# Patient Record
Sex: Female | Born: 1990 | Race: Black or African American | Hispanic: No | Marital: Single | Smoking: Current every day smoker
Health system: Southern US, Community
[De-identification: ages and names within clinical notes are randomized; demographics above are authoritative.]

## PROBLEM LIST (undated history)

## (undated) ENCOUNTER — Inpatient Hospital Stay (HOSPITAL_COMMUNITY): Payer: Self-pay

## (undated) DIAGNOSIS — A630 Anogenital (venereal) warts: Secondary | ICD-10-CM

## (undated) DIAGNOSIS — R87629 Unspecified abnormal cytological findings in specimens from vagina: Secondary | ICD-10-CM

## (undated) DIAGNOSIS — Z789 Other specified health status: Secondary | ICD-10-CM

## (undated) HISTORY — DX: Unspecified abnormal cytological findings in specimens from vagina: R87.629

## (undated) HISTORY — PX: DILATION AND CURETTAGE, DIAGNOSTIC / THERAPEUTIC: SUR384

## (undated) HISTORY — PX: OTHER SURGICAL HISTORY: SHX169

## (undated) HISTORY — DX: Anogenital (venereal) warts: A63.0

---
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
## 2012-05-12 ENCOUNTER — Encounter (HOSPITAL_COMMUNITY): Payer: Self-pay | Admitting: *Deleted

## 2012-05-12 ENCOUNTER — Emergency Department (HOSPITAL_COMMUNITY)
Admission: EM | Admit: 2012-05-12 | Discharge: 2012-05-12 | Disposition: A | Discharge status: Home / Self Care | Payer: BC Managed Care – PPO | Attending: Emergency Medicine | Admitting: Emergency Medicine

## 2012-05-12 DIAGNOSIS — T07XXXA Unspecified multiple injuries, initial encounter: Secondary | ICD-10-CM

## 2012-05-12 DIAGNOSIS — T148XXA Other injury of unspecified body region, initial encounter: Secondary | ICD-10-CM

## 2012-05-12 DIAGNOSIS — IMO0002 Reserved for concepts with insufficient information to code with codable children: Secondary | ICD-10-CM | POA: Insufficient documentation

## 2012-05-12 MED ORDER — MELOXICAM 7.5 MG PO TABS: ORAL_TABLET | ORAL | Status: DC

## 2012-05-12 MED ORDER — HYDROCODONE-ACETAMINOPHEN 5-325 MG PO TABS: 1.0000 | ORAL_TABLET | ORAL | Status: DC | PRN
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
## 2012-05-14 NOTE — ED Provider Notes (Signed)
Medical screening examination/treatment/procedure(s) were performed by non-physician practitioner and as supervising physician I was immediately available for consultation/collaboration.   Dione Booze, MD 05/14/12 1450

## 2014-06-12 ENCOUNTER — Encounter (HOSPITAL_COMMUNITY): Payer: Self-pay | Admitting: *Deleted

## 2014-07-12 ENCOUNTER — Other Ambulatory Visit: Payer: Self-pay | Admitting: Women's Health

## 2014-07-26 ENCOUNTER — Other Ambulatory Visit: Payer: Self-pay | Admitting: Women's Health

## 2015-12-25 ENCOUNTER — Other Ambulatory Visit: Payer: Self-pay | Admitting: Adult Health

## 2015-12-27 ENCOUNTER — Other Ambulatory Visit: Payer: Self-pay | Admitting: Adult Health

## 2015-12-27 ENCOUNTER — Encounter: Payer: Self-pay | Admitting: *Deleted

## 2016-05-22 ENCOUNTER — Other Ambulatory Visit: Payer: Self-pay | Admitting: Adult Health

## 2016-05-28 ENCOUNTER — Encounter: Payer: Self-pay | Admitting: *Deleted

## 2016-05-28 ENCOUNTER — Other Ambulatory Visit: Payer: Self-pay | Admitting: Adult Health

## 2016-06-05 ENCOUNTER — Other Ambulatory Visit (HOSPITAL_COMMUNITY)
Admission: RE | Admit: 2016-06-05 | Discharge: 2016-06-05 | Disposition: A | Discharge status: Home / Self Care | Payer: BLUE CROSS/BLUE SHIELD | Source: Ambulatory Visit | Attending: Advanced Practice Midwife | Admitting: Advanced Practice Midwife

## 2016-06-05 ENCOUNTER — Encounter: Payer: Self-pay | Admitting: Advanced Practice Midwife

## 2016-06-05 ENCOUNTER — Ambulatory Visit (INDEPENDENT_AMBULATORY_CARE_PROVIDER_SITE_OTHER): Payer: Medicaid Other | Admitting: Advanced Practice Midwife

## 2016-06-05 ENCOUNTER — Other Ambulatory Visit: Payer: Self-pay | Admitting: Advanced Practice Midwife

## 2016-06-05 VITALS — BP 110/70 | HR 74 | Ht 65.0 in | Wt 218.0 lb

## 2016-06-05 DIAGNOSIS — Z01419 Encounter for gynecological examination (general) (routine) without abnormal findings: Secondary | ICD-10-CM | POA: Diagnosis not present

## 2016-06-05 DIAGNOSIS — B9689 Other specified bacterial agents as the cause of diseases classified elsewhere: Secondary | ICD-10-CM

## 2016-06-05 DIAGNOSIS — Z113 Encounter for screening for infections with a predominantly sexual mode of transmission: Secondary | ICD-10-CM | POA: Diagnosis present

## 2016-06-05 DIAGNOSIS — Z1151 Encounter for screening for human papillomavirus (HPV): Secondary | ICD-10-CM | POA: Insufficient documentation

## 2016-06-05 DIAGNOSIS — Z0000 Encounter for general adult medical examination without abnormal findings: Secondary | ICD-10-CM

## 2016-06-05 DIAGNOSIS — N760 Acute vaginitis: Secondary | ICD-10-CM

## 2016-06-05 DIAGNOSIS — Z01411 Encounter for gynecological examination (general) (routine) with abnormal findings: Secondary | ICD-10-CM | POA: Diagnosis present

## 2016-06-05 MED ORDER — ETONOGESTREL-ETHINYL ESTRADIOL 0.12-0.015 MG/24HR VA RING: VAGINAL_RING | VAGINAL | 12 refills | Status: DC

## 2016-06-05 MED ORDER — METRONIDAZOLE 0.75 % VA GEL
1.0000 | Freq: Every day | VAGINAL | 1 refills | Status: DC
Start: 2016-06-05 — End: 2016-07-10

## 2016-06-05 NOTE — Progress Notes (Signed)
Latoya [name] 25 y.o.  Vitals:   06/05/16 1518  BP: 110/70  Pulse: 74     Filed Weights   06/05/16 1518  Weight: 218 lb (98.9 kg)    Past Medical History: Past Medical History:  Diagnosis Date  . No pertinent past medical history     Past Surgical History: Past Surgical History:  Procedure Laterality Date  . NO PAST SURGERIES      Family History: History reviewed. No pertinent family history.  Social History: Social History  Substance Use Topics  . Smoking status: Never Smoker  . Smokeless tobacco: Never Used  . Alcohol use No    Allergies: No Known Allergies   No current outpatient prescriptions on file.  History of Present Illness: here for pap.  Last pap never.  Is currently having sex.  Uses Mirena for birth control  Is due to come out now  Wants to do nuva ring instead (some pain, wonders if it's IUD).     Review of Systems   Patient denies any headaches, blurred vision, shortness of breath, chest pain, abdominal pain, problems with bowel movements, urination.  Sometimes intercourse is painful, wonders if it's the IU.   Physical Exam: General:  Well developed, well nourished, no acute distress Skin:  Warm and dry Neck:  Midline trachea, normal thyroid Lungs; Clear to auscultation bilaterally Breast:  No dominant palpable mass, retraction, or nipple discharge Cardiovascular: Regular rate and rhythm Abdomen:  Soft, non tender, no hepatosplenomegaly Pelvic:  External genitalia is normal in appearance.  The vagina is normal in appearance.  Thin white DC with aminie odor, c/w BV. The cervix is bulbous. Uterus is felt to be normal size, shape, and contour.  No adnexal masses or tenderness noted. Exam limited by habitus.  Extremities:  No swelling or varicosities noted Psych:  No mood changes.     Impression: normal GYN exam except BV     Plan: if pap normal. Repeat in 3 years Rx metrogel  Start Nuva Ring today Remove IUD in 3 weeks Info on  gardisil given; let us know if she wants a Rx

## 2016-06-05 NOTE — Addendum Note (Signed)
Addended by: Federico FlakeNES, Miron Marxen A on: 06/05/2016 04:03 PM   Modules accepted: Orders

## 2016-06-05 NOTE — Patient Instructions (Signed)
HPV (Human Papillomavirus) Vaccine--Gardasil-9:  1. Why get vaccinated? Gardasil-9 prevents human papillomavirus (HPV) types that cause many cancers, including:  cervical cancer in females,  vaginal and vulvar cancers in females,  anal cancer in females and males,  throat cancer in females and males, and  penile cancer in males. In addition, Gardasil-9 prevents HPV types that cause genital warts in both females and males. In the [place] about 12,000 women get cervical cancer every year, and about 4,000 women die from it. Gardasil-9 can prevent most of these cases of cervical cancer. Vaccination is not a substitute for cervical cancer screening. This vaccine does not protect against all HPV types that can cause cervical cancer. Women should still get regular Pap tests. HPV infection usually comes from sexual contact, and most people will become infected at some point in their life. About 14 million Americans, including teens, get infected every year. Most infections will go away and not cause serious problems. But thousands of women and men get cancer and diseases from HPV. 2. HPV vaccine Gardasil-9 is an FDA-approved HPV vaccine. It is recommended for both males and females. It is routinely given at 11 or 23 years of age, but it may be given beginning at age 9 years through age 26 years. Three doses of Gardasil-9 are recommended with the second dose given 1-2 months after the first dose and the third dose given 6 months after the first dose. 3. Some people should not get this vaccine  Anyone who has had a severe, life-threatening allergic reaction to a dose of HPV vaccine should not get another dose.  Anyone who has a severe (life threatening) allergy to any component of HPV vaccine should not get the vaccine. Tell your doctor if you have any severe allergies that you know of, including a severe allergy to yeast.  HPV vaccine is not recommended for pregnant women. If you learn that you were  pregnant when you were vaccinated, there is no reason to expect any problems for you or your baby. Any woman who learns she was pregnant when she got Gardasil-9 vaccine is encouraged to contact the manufacturer's registry for HPV vaccination during pregnancy at 1-800-986-8999. Women who are breastfeeding may be vaccinated.  If you have a mild illness, such as a cold, you can probably get the vaccine today. If you are moderately or severely ill, you should probably wait until you recover. Your doctor can advise you. 4. Risks of a vaccine reaction With any medicine, including vaccines, there is a chance of side effects. These are usually mild and go away on their own, but serious reactions are also possible. Most people who get HPV vaccine do not have any serious problems with it. Mild or moderate problems following Gardasil-9:  Reactions in the arm where the shot was given:  Soreness (about 9 people in 10)  Redness or swelling (about 1 person in 3)  Fever:  Mild (100F) (about 1 person in 10)  Moderate (102F) (about 1 person in 65)  Other problems:  Headache (about 1 person in 3) Problems that could happen after any injected vaccine:  People sometimes faint after a medical procedure, including vaccination. Sitting or lying down for about 15 minutes can help prevent fainting, and injuries caused by a fall. Tell your doctor if you feel dizzy, or have vision changes or ringing in the ears.  Some people get severe pain in the shoulder and have difficulty moving the arm where a shot was given. This happens   very rarely.  Any medication can cause a severe allergic reaction. Such reactions from a vaccine are very rare, estimated at about 1 in a million doses, and would happen within a few minutes to a few hours after the vaccination. As with any medicine, there is a very remote chance of a vaccine causing a serious injury or death. The safety of vaccines is always being monitored. For more  information, visit: www.cdc.gov/vaccinesafety/. 5. What if there is a serious reaction? What should I look for? Look for anything that concerns you, such as signs of a severe allergic reaction, very high fever, or unusual behavior. Signs of a severe allergic reaction can include hives, swelling of the face and throat, difficulty breathing, a fast heartbeat, dizziness, and weakness. These would usually start a few minutes to a few hours after the vaccination. What should I do? If you think it is a severe allergic reaction or other emergency that can't wait, call 9-1-1 or get to the nearest hospital. Otherwise, call your doctor. Afterward, the reaction should be reported to the "Vaccine Adverse Event Reporting System" (VAERS). Your doctor might file this report, or you can do it yourself through the VAERS web site at www.vaers.hhs.gov, or by calling 1-800-822-7967. VAERS does not give medical advice. 6. The National Vaccine Injury Compensation Program The National Vaccine Injury Compensation Program (VICP) is a federal program that was created to compensate people who may have been injured by certain vaccines. Persons who believe they may have been injured by a vaccine can learn about the program and about filing a claim by calling 1-800-338-2382 or visiting the VICP website at www.hrsa.gov/vaccinecompensation. There is a time limit to file a claim for compensation. 7. How can I learn more?  Ask your health care provider. He or she can give you the vaccine package insert or suggest other sources of information.  Call your local or state health department.  Contact the Centers for Disease Control and Prevention (CDC):  Call 1-800-232-4636 (1-800-CDC-INFO) or  Visit CDC's website at www.cdc.gov/hpv Vaccine Information Statement HPV Vaccine (Gardasil-9) 11/09/14   This information is not intended to replace advice given to you by your health care provider. Make sure you discuss any questions you  have with your health care provider.   Document Released: 02/22/2014 Document Revised: 12/12/2014 Document Reviewed: 02/22/2014 Elsevier Interactive Patient Education 2016 Elsevier Inc. 

## 2016-06-10 ENCOUNTER — Telehealth: Payer: Self-pay | Admitting: *Deleted

## 2016-06-10 ENCOUNTER — Other Ambulatory Visit: Payer: Self-pay | Admitting: Women's Health

## 2016-06-10 DIAGNOSIS — R87619 Unspecified abnormal cytological findings in specimens from cervix uteri: Secondary | ICD-10-CM | POA: Insufficient documentation

## 2016-06-10 DIAGNOSIS — R87610 Atypical squamous cells of undetermined significance on cytologic smear of cervix (ASC-US): Secondary | ICD-10-CM

## 2016-06-10 DIAGNOSIS — A749 Chlamydial infection, unspecified: Secondary | ICD-10-CM

## 2016-06-10 LAB — CYTOLOGY - PAP
CHLAMYDIA, DNA PROBE: POSITIVE — AB
Diagnosis: UNDETERMINED — AB
HPV (WINDOPATH): NOT DETECTED
Neisseria Gonorrhea: NEGATIVE

## 2016-06-10 MED ORDER — AZITHROMYCIN 500 MG PO TABS: 1000.0000 mg | ORAL_TABLET | Freq: Once | ORAL | 0 refills | Status: AC

## 2016-06-10 NOTE — Telephone Encounter (Signed)
Pt called at states was there anything other than the CHL abnormal on her pap. Pt informed of abnormal pap from 06/05/2016 with ASCUS results and positive CHL. Azithromycin 500 mg e-scribed. Pt verbalized understanding.

## 2016-06-10 NOTE — Telephone Encounter (Signed)
Called patient to relay message from LindyKim about pap smear results and  positive Chlamydia and need for treatment. Patient stated she would pick up medication. States she will talk to partner to see if he will go to HD or get Selena BattenKim to treat. Will call us back if we need to treat him. No further questions.

## 2016-06-19 ENCOUNTER — Telehealth: Payer: Self-pay | Admitting: Women's Health

## 2016-06-19 NOTE — Telephone Encounter (Signed)
Patient called with concerns of possible yeast infection. She has recently been on antibiotics for +CHL. Advised patient to try over the counter Monistat and see if that helps. Patient has an appt on 11/15.

## 2016-06-19 NOTE — Telephone Encounter (Signed)
Pt called stating that she would like a phone call back From Tish. Pt did not state the reason why. Please contact pt

## 2016-06-26 ENCOUNTER — Ambulatory Visit: Payer: Medicaid Other | Admitting: Advanced Practice Midwife

## 2016-07-10 ENCOUNTER — Ambulatory Visit (INDEPENDENT_AMBULATORY_CARE_PROVIDER_SITE_OTHER): Payer: BLUE CROSS/BLUE SHIELD | Admitting: Women's Health

## 2016-07-10 ENCOUNTER — Encounter: Payer: Self-pay | Admitting: Women's Health

## 2016-07-10 VITALS — BP 110/60 | HR 82 | Ht 62.0 in | Wt 215.5 lb

## 2016-07-10 DIAGNOSIS — Z30432 Encounter for removal of intrauterine contraceptive device: Secondary | ICD-10-CM

## 2016-07-10 DIAGNOSIS — A749 Chlamydial infection, unspecified: Secondary | ICD-10-CM

## 2016-07-10 NOTE — Progress Notes (Signed)
Latoya [name] is a 25 y.o. year old [id number] African American female who presents for removal of a Mirena IUD. Her Brock RaMirnea IUD was placed 2014, she wants it removed d/t pain, cramping. Was tx for +CT 06/10/16, both her and partner took meds. Pap was ASCUS w/ -HRHPV.   Patient's last menstrual period was 06/19/2016 (approximate). BP 110/60 (BP Location: Left Arm, Patient Position: Sitting, Cuff Size: Large)   Pulse 82   Ht 5\' 2"  (1.575 m)   Wt 215 lb 8 oz (97.8 kg)   LMP 06/19/2016 (Approximate)   BMI 39.42 kg/m   Time out was performed.  A graves speculum was placed in the vagina.  The cervix was visualized, and the strings were visible. They were grasped and the Mirena was easily removed intact without complications.   CT POC today Continue Nuva Ring rx'd at last visit Condoms always for STI prevention F/U 5225yr for pap & physical  Marge DuncansBooker, Yacob Wilkerson Randall CNM, [facility]NP-BC 07/10/2016 12:24 PM

## 2016-07-14 LAB — GC/CHLAMYDIA PROBE AMP
Chlamydia trachomatis, NAA: NEGATIVE
NEISSERIA GONORRHOEAE BY PCR: NEGATIVE

## 2016-07-30 ENCOUNTER — Telehealth: Payer: Self-pay | Admitting: *Deleted

## 2016-07-30 NOTE — Telephone Encounter (Signed)
Patient has been bleeding on Novaring since she has started using it. Can she be prescribed to help stop the bleeding? Please advise.

## 2016-08-06 NOTE — Telephone Encounter (Signed)
Spoke with patient regarding response from Genella RifeK. Booker, CNM that she could not take Megace while on Nuvaring. Patient states she is not bleeding now so maybe her body has adjusted. No further question.

## 2016-08-31 ENCOUNTER — Emergency Department (HOSPITAL_COMMUNITY)
Admission: EM | Admit: 2016-08-31 | Discharge: 2016-09-01 | Disposition: A | Discharge status: Home / Self Care | Payer: Medicaid Other | Attending: Emergency Medicine | Admitting: Emergency Medicine

## 2016-08-31 ENCOUNTER — Encounter (HOSPITAL_COMMUNITY): Payer: Self-pay | Admitting: *Deleted

## 2016-08-31 ENCOUNTER — Emergency Department (HOSPITAL_COMMUNITY): Payer: Medicaid Other

## 2016-08-31 DIAGNOSIS — J40 Bronchitis, not specified as acute or chronic: Secondary | ICD-10-CM | POA: Diagnosis not present

## 2016-08-31 DIAGNOSIS — R05 Cough: Secondary | ICD-10-CM | POA: Diagnosis present

## 2016-08-31 DIAGNOSIS — F17210 Nicotine dependence, cigarettes, uncomplicated: Secondary | ICD-10-CM | POA: Insufficient documentation

## 2016-08-31 NOTE — ED Triage Notes (Signed)
Pt c/o nasal congestion, cough that is productive with green to yellow sputum production for the past week

## 2016-09-01 MED ORDER — ALBUTEROL SULFATE HFA 108 (90 BASE) MCG/ACT IN AERS
2.0000 | INHALATION_SPRAY | Freq: Once | RESPIRATORY_TRACT | Status: DC
  Filled 2016-09-01: qty 6.7

## 2016-09-01 MED ORDER — PREDNISONE 20 MG PO TABS: 40.0000 mg | ORAL_TABLET | Freq: Every day | ORAL | 0 refills | Status: DC

## 2016-09-01 MED ORDER — AZITHROMYCIN 250 MG PO TABS: ORAL_TABLET | ORAL | 0 refills | Status: DC

## 2016-09-01 MED ORDER — GUAIFENESIN-CODEINE 100-10 MG/5ML PO SYRP: 10.0000 mL | ORAL_SOLUTION | Freq: Three times a day (TID) | ORAL | 0 refills | Status: DC | PRN

## 2016-09-01 MED ORDER — GUAIFENESIN-CODEINE 100-10 MG/5ML PO SOLN
10.0000 mL | Freq: Once | ORAL | Status: AC
  Administered 2016-09-01: 10 mL via ORAL
  Filled 2016-09-01: qty 10

## 2016-09-01 NOTE — Discharge Instructions (Signed)
2 puffs of the albuterol inhaler 4 times a day as needed. Tylenol or ibuprofen if needed for fever. Follow-up with your primary doctor for recheck, return to the ER for any worsening symptoms

## 2016-09-01 NOTE — ED Provider Notes (Signed)
[facility]-EMER[facility]ENCY DEPT Provider Note   CSN: [id number] Arrival date & time: 08/31/16  2217     History   Chief Complaint Chief Complaint  Patient presents with  . Cough    HPI GAL SMOLINSKI is a 26 y.o. female.  HPI   YANIRIS BRADDOCK is a 26 y.o. female who presents to the Emergency Department complaining of Persistent cough for over one week. She states the cough has been productive at times. It has been associated with nasal congestion, sore throat, and chest tightness with cough and near emesis with cough. She's been taking over-the-counter cough and cold medications without relief. She denies known fever, vomiting or hemoptysis, chest pain or shortness of breath. She states that lying down makes her cough worse.   Past Medical History:  Diagnosis Date  . No pertinent past medical history     Patient Active Problem List   Diagnosis Date Noted  . Chlamydia 06/10/2016  . Abnormal Pap smear of cervix 06/10/2016    Past Surgical History:  Procedure Laterality Date  . NO PAST SURGERIES      OB History    Gravida Para Term Preterm AB Living   1 1 1  0 0 1   SAB TAB Ectopic Multiple Live Births   0 0 0 0 1       Home Medications    Prior to Admission medications   Medication Sig Start Date End Date Taking? Authorizing Provider  etonogestrel-ethinyl estradiol (NUVARING) 0.12-0.015 MG/24HR vaginal ring Insert vaginally and leave in place for 3 consecutive weeks, then remove for 1 week. 06/05/16   Jacklyn Shell, CNM    Family History Family History  Problem Relation Age of Onset  . Colon cancer Paternal Grandfather   . Diabetes Paternal Grandmother   . Hypertension Mother     Social History Social History  Substance Use Topics  . Smoking status: Current Every Day Smoker    Types: Cigarettes  . Smokeless tobacco: Never Used     Comment: smokes 4-5 cig daily  . Alcohol use No     Allergies   Patient has no known allergies.   Review  of Systems Review of Systems  Constitutional: Negative for activity change, appetite change, chills and fever.  HENT: Positive for congestion, rhinorrhea and sore throat. Negative for facial swelling and trouble swallowing.   Eyes: Negative for visual disturbance.  Respiratory: Positive for cough and chest tightness. Negative for shortness of breath, wheezing and stridor.   Cardiovascular: Negative for chest pain.  Gastrointestinal: Negative for abdominal pain, nausea and vomiting.  Musculoskeletal: Positive for myalgias. Negative for neck pain and neck stiffness.  Skin: Negative.   Neurological: Negative for dizziness, weakness, numbness and headaches.  Hematological: Negative for adenopathy.  Psychiatric/Behavioral: Negative for confusion.  All other systems reviewed and are negative.    Physical Exam Updated Vital Signs BP 146/72 (BP Location: Left Arm)   Pulse 84   Temp 98.3 F (36.8 C) (Oral)   Resp 16   Ht 5\' 2"  (1.575 m)   Wt 98.9 kg   LMP 08/31/2016   SpO2 100%   BMI 39.87 kg/m   Physical Exam  Constitutional: She is oriented to person, place, and time. She appears well-developed and well-nourished. No distress.  HENT:  Head: Normocephalic and atraumatic.  Right Ear: Tympanic membrane and ear canal normal.  Left Ear: Tympanic membrane and ear canal normal.  Nose: Mucosal edema and rhinorrhea present.  Mouth/Throat: Uvula is midline and mucous  membranes are normal. No trismus in the jaw. No uvula swelling. Posterior oropharyngeal erythema present. No oropharyngeal exudate, posterior oropharyngeal edema or tonsillar abscesses.  Eyes: Conjunctivae are normal.  Neck: Normal range of motion and phonation normal. Neck supple. No Brudzinski's sign and no Kernig's sign noted.  Cardiovascular: Normal rate, regular rhythm, normal heart sounds and intact distal pulses.   No murmur heard. Pulmonary/Chest: Effort normal and breath sounds normal. No respiratory distress. She has  no wheezes. She has no rales.  Coarse lung sounds bilaterally without rales or wheezing.  Abdominal: Soft. She exhibits no distension. There is no tenderness. There is no rebound and no guarding.  Musculoskeletal: Normal range of motion. She exhibits no edema.  Lymphadenopathy:    She has no cervical adenopathy.  Neurological: She is alert and oriented to person, place, and time. She exhibits normal muscle tone. Coordination normal.  Skin: Skin is warm and dry.  Nursing note and vitals reviewed.    ED Treatments / Results  Labs (all labs ordered are listed, but only abnormal results are displayed) Labs Reviewed - No data to display  EKG  EKG Interpretation None       Radiology Dg Chest 2 View  Result Date: 08/31/2016 CLINICAL DATA:  26 year old female with cough. EXAM: CHEST  2 VIEW COMPARISON:  Chest radiograph dated 05/10/2004 FINDINGS: The heart size and mediastinal contours are within normal limits. Both lungs are clear. The visualized skeletal structures are unremarkable. IMPRESSION: No active cardiopulmonary disease. Electronically Signed   By: Elgie CollardArash  Radparvar M.D.   On: 08/31/2016 23:18    Procedures Procedures (including critical care time)  Medications Ordered in ED Medications  albuterol (PROVENTIL HFA;VENTOLIN HFA) 108 (90 Base) MCG/ACT inhaler 2 puff (not administered)  guaiFENesin-codeine 100-10 MG/5ML solution 10 mL (not administered)     Initial Impression / Assessment and Plan / ED Course  I have reviewed the triage vital signs and the nursing notes.  Pertinent labs & imaging results that were available during my care of the patient were reviewed by me and considered in my medical decision making (see chart for details).     Albuterol inhaler dispensed.  Likely bronchitis.  Doubt PE.  No dyspnea, tachycardia, or hypoxia.  Patient appears stable for discharge. She agrees to Rx for prednisone, azithromycin,  Robitussin AC and PCP follow-up if  needed.  Final Clinical Impressions(s) / ED Diagnoses   Final diagnoses:  Bronchitis    New Prescriptions New Prescriptions   No medications on file     Rosey Bathammy Silas Sedam, PA-C 09/01/16 1339    Glynn OctaveStephen Rancour, MD 09/01/16 2005

## 2016-09-23 DIAGNOSIS — R109 Unspecified abdominal pain: Secondary | ICD-10-CM | POA: Diagnosis not present

## 2016-09-23 DIAGNOSIS — N939 Abnormal uterine and vaginal bleeding, unspecified: Secondary | ICD-10-CM | POA: Diagnosis not present

## 2016-09-23 DIAGNOSIS — Z3490 Encounter for supervision of normal pregnancy, unspecified, unspecified trimester: Secondary | ICD-10-CM | POA: Diagnosis not present

## 2016-09-26 ENCOUNTER — Ambulatory Visit (INDEPENDENT_AMBULATORY_CARE_PROVIDER_SITE_OTHER): Payer: Medicaid Other | Admitting: Adult Health

## 2016-09-26 ENCOUNTER — Encounter: Payer: Self-pay | Admitting: Adult Health

## 2016-09-26 VITALS — BP 120/70 | HR 74 | Ht 65.0 in | Wt 229.0 lb

## 2016-09-26 DIAGNOSIS — Z3202 Encounter for pregnancy test, result negative: Secondary | ICD-10-CM | POA: Diagnosis not present

## 2016-09-26 DIAGNOSIS — F32A Depression, unspecified: Secondary | ICD-10-CM

## 2016-09-26 DIAGNOSIS — N926 Irregular menstruation, unspecified: Secondary | ICD-10-CM | POA: Diagnosis not present

## 2016-09-26 DIAGNOSIS — F329 Major depressive disorder, single episode, unspecified: Secondary | ICD-10-CM | POA: Diagnosis not present

## 2016-09-26 DIAGNOSIS — R110 Nausea: Secondary | ICD-10-CM

## 2016-09-26 LAB — POCT URINE PREGNANCY: Preg Test, Ur: NEGATIVE

## 2016-09-26 NOTE — Progress Notes (Signed)
Subjective:     Patient ID: Latoya [name], female   DOB: 04/07/1991, 26 y.o.   MRN: [id number]  HPI Alphonzo LemmingsWhitney is a 26 year old black female in complaining of irregular bleeding, on and of since 08/31/16.She had IUD removed 07/10/16 and periods had been regular, she was supposed to be on nuva ring but lost it and has not gotten another.She had some nausea and took HPT and it was positive.[facility] and was treated for infection with azithromycin and she said pregnancy test was positive there, will request records.  Review of Systems +irregular bleeding + nausea  Reviewed past medical,surgical, social and family history. Reviewed medications and allergies.     Objective:   Physical Exam BP 120/70   Pulse 74   Ht 5\' 5"  (1.651 m)   Wt 229 lb (103.9 kg)   LMP 08/31/2016   BMI 38.11 kg/m UPT negative. Skin warm and dry.Pelvic: external genitalia is normal in appearance no lesions, vagina: period like blood,urethra has no lesions or masses noted, cervix:smooth and bulbous, uterus: normal size, shape and contour, non tender, no masses felt, adnexa: no masses or tenderness noted. Bladder is non tender and no masses felt.    PHQ 9 score 12, denies any suicidal ideations, declines meds at this time.Declines for nausea, try to eat often. Will get [facility] now. Blood type A+. Assessment:     Irregular bleeding - Plan: Beta HCG, Quant  Depression, unspecified depression type  Pregnancy test negative - Plan: POCT urine pregnancy      Plan:     [facility] Will talk when results back Follow up prn

## 2016-09-27 LAB — BETA HCG QUANT (REF LAB): hCG Quant: 15 m[IU]/mL

## 2016-09-29 ENCOUNTER — Telehealth: Payer: Self-pay | Admitting: Adult Health

## 2016-09-29 DIAGNOSIS — O039 Complete or unspecified spontaneous abortion without complication: Secondary | ICD-10-CM

## 2016-09-29 NOTE — Telephone Encounter (Signed)
[facility] and that it was 15 09/26/16, so this is miscarriage.[facility] and can put nuva ring in Sunday

## 2017-01-14 ENCOUNTER — Ambulatory Visit (INDEPENDENT_AMBULATORY_CARE_PROVIDER_SITE_OTHER): Payer: Medicaid Other | Admitting: Adult Health

## 2017-01-14 ENCOUNTER — Encounter: Payer: Self-pay | Admitting: Adult Health

## 2017-01-14 VITALS — BP 112/50 | HR 78 | Ht 66.0 in | Wt 236.5 lb

## 2017-01-14 DIAGNOSIS — Z3490 Encounter for supervision of normal pregnancy, unspecified, unspecified trimester: Secondary | ICD-10-CM

## 2017-01-14 DIAGNOSIS — N912 Amenorrhea, unspecified: Secondary | ICD-10-CM

## 2017-01-14 DIAGNOSIS — O3680X0 Pregnancy with inconclusive fetal viability, not applicable or unspecified: Secondary | ICD-10-CM | POA: Diagnosis not present

## 2017-01-14 DIAGNOSIS — Z3201 Encounter for pregnancy test, result positive: Secondary | ICD-10-CM

## 2017-01-14 LAB — POCT URINE PREGNANCY: Preg Test, Ur: POSITIVE — AB

## 2017-01-14 MED ORDER — PRENATAL PLUS/IRON 27-1 MG PO TABS: ORAL_TABLET | ORAL | 12 refills | Status: DC

## 2017-01-14 NOTE — Progress Notes (Signed)
Subjective:     Patient ID: Latoya [name], female   DOB: 11/11/1990, 26 y.o.   MRN: [id number]  HPI Latoya [name] is a 26 year old black female in for UPT, has missed a period and had 2+HPT.She has had nausea and cramping, no bleeding.   Review of Systems +missed period with +HPT Nausea and cramping, no bleeding Reviewed past medical,surgical, social and family history. Reviewed medications and allergies.     Objective:   Physical Exam BP (!) 112/50 (BP Location: Left Arm, Patient Position: Sitting, Cuff Size: Large)   Pulse 78   Ht 5\' 6"  (1.676 m)   Wt 236 lb 8 oz (107.3 kg)   LMP 12/01/2016 (Approximate)   BMI 38.17 kg/m UPT +, about 6+2 weeks by LMP with EDD 09/07/17. Skin warm and dry. Neck: mid line trachea, normal thyroid, good ROM, no lymphadenopathy noted. Lungs: clear to ausculation bilaterally. Cardiovascular: regular rate and rhythm.Abdomen is soft and non tender.    Will get US in 1 week.  Assessment:     1. Pregnancy examination or test, positive result   2. Pregnancy, unspecified gestational age   653. Encounter to determine fetal viability of pregnancy, single or unspecified fetus       Plan:     Meds ordered this encounter  Medications  . Prenatal Vit-Fe Fumarate-FA (PRENATAL PLUS/IRON) 27-1 MG TABS    Sig: Take 1 daily    Dispense:  30 each    Refill:  12    Order Specific Question:   Supervising Provider    Answer:   Duane LopeEURE, LUTHER H [[id number]]  Return in 1 week for dating US Increase fluids, eat often  And decrease smoking

## 2017-01-14 NOTE — Patient Instructions (Signed)
Eat often  Push fluids

## 2017-01-23 ENCOUNTER — Other Ambulatory Visit: Payer: Medicaid Other

## 2017-01-29 ENCOUNTER — Ambulatory Visit (INDEPENDENT_AMBULATORY_CARE_PROVIDER_SITE_OTHER): Payer: Medicaid Other

## 2017-01-29 DIAGNOSIS — O3680X0 Pregnancy with inconclusive fetal viability, not applicable or unspecified: Secondary | ICD-10-CM

## 2017-01-29 NOTE — Progress Notes (Signed)
[place] 8+3 wks,single IUP w/ys,pos fht 164 bpm,normal ovaries bilat,crl 19.08 mm,EDD 09/07/2017

## 2017-02-13 ENCOUNTER — Ambulatory Visit (INDEPENDENT_AMBULATORY_CARE_PROVIDER_SITE_OTHER): Payer: Medicaid Other | Admitting: Women's Health

## 2017-02-13 ENCOUNTER — Encounter: Payer: Self-pay | Admitting: Women's Health

## 2017-02-13 ENCOUNTER — Ambulatory Visit: Payer: Medicaid Other | Admitting: *Deleted

## 2017-02-13 VITALS — BP 120/70 | HR 91 | Wt 240.0 lb

## 2017-02-13 DIAGNOSIS — F17200 Nicotine dependence, unspecified, uncomplicated: Secondary | ICD-10-CM | POA: Diagnosis not present

## 2017-02-13 DIAGNOSIS — Z3481 Encounter for supervision of other normal pregnancy, first trimester: Secondary | ICD-10-CM

## 2017-02-13 DIAGNOSIS — Z3490 Encounter for supervision of normal pregnancy, unspecified, unspecified trimester: Secondary | ICD-10-CM | POA: Insufficient documentation

## 2017-02-13 DIAGNOSIS — Z3A10 10 weeks gestation of pregnancy: Secondary | ICD-10-CM

## 2017-02-13 DIAGNOSIS — Z1389 Encounter for screening for other disorder: Secondary | ICD-10-CM

## 2017-02-13 DIAGNOSIS — Z131 Encounter for screening for diabetes mellitus: Secondary | ICD-10-CM

## 2017-02-13 LAB — POCT URINALYSIS DIPSTICK
Blood, UA: NEGATIVE
Glucose, UA: NEGATIVE
KETONES UA: NEGATIVE
LEUKOCYTES UA: NEGATIVE
NITRITE UA: NEGATIVE
PROTEIN UA: NEGATIVE

## 2017-02-13 NOTE — Progress Notes (Signed)
Subjective:  Latoya [name] is a 26 y.o. G40P1011 African American female at [redacted]w[redacted]d by LMP c/w 8wk u/s, being seen today for her first obstetrical visit.  Her obstetrical history is significant for term VAVB x 1, sab x1; smoker- 1ppd prior to pregnancy, now down to 1-2/day and trying to quit; abnormal pap 10/26- needs repeat after 10/26.  Pregnancy history fully reviewed.  Patient reports some nausea- declines meds; some depression- is situational- currently living w/ her parents and extended family- lots of tension- trying to find her own home before this baby comes- denies SI. Marland Kitchen Denies vb, cramping, uti s/s, abnormal/malodorous vag d/c, or vulvovaginal itching/irritation.  BP 120/70   Pulse 91   Wt 240 lb (108.9 kg)   LMP 12/01/2016 (Approximate)   BMI 38.74 kg/m   HISTORY: OB History  Gravida Para Term Preterm AB Living  3 1 1  0 1 1  SAB TAB Ectopic Multiple Live Births  1 0 0 0 1    # Outcome Date GA Lbr Len/2nd Weight Sex Delivery Anes PTL Lv  3 Current           2 SAB 09/2016          1 Term 05/09/11 [redacted]w[redacted]d 13:36 / 01:49 7 lb 4.8 oz (3.311 kg) M Vag-Vacuum EPI N LIV     Past Medical History:  Diagnosis Date  . No pertinent past medical history   . Vaginal Pap smear, abnormal    Past Surgical History:  Procedure Laterality Date  . NO PAST SURGERIES     Family History  Problem Relation Age of Onset  . Colon cancer Paternal Grandfather   . Diabetes Paternal Grandmother   . Cancer Maternal Grandmother        breast  . Cancer Maternal Grandfather   . Hypertension Mother   . Stroke Maternal Aunt   . Mental retardation Maternal Aunt   . Cancer Maternal Aunt        breast  . Mental retardation Maternal Uncle   . Hypertension Maternal Uncle   . Stroke Maternal Uncle   . Diabetes Maternal Uncle   . Cancer Maternal Uncle        throat    Exam   System:     General: Well developed & nourished, no acute distress   Skin: Warm & dry, normal coloration and turgor,  no rashes   Neurologic: Alert & oriented, normal mood   Cardiovascular: Regular rate & rhythm   Respiratory: Effort & rate normal, LCTAB, acyanotic   Abdomen: Soft, non tender   Extremities: normal strength, tone  Thin prep pap smear ASCUS w/ neg HRHPV 06/05/16  FHR: + w/ active fetus via informal transabdominal u/s   Assessment:   Pregnancy: G3P1011 Patient Active Problem List   Diagnosis Date Noted  . Supervision of normal pregnancy 02/13/2017  . Smoker 02/13/2017  . Chlamydia 06/10/2016  . Abnormal Pap smear of cervix 06/10/2016    [redacted]w[redacted]d G3P1011 New OB visit Situational depression Abnormal pap Smoker  Plan:  Initial labs obtained Continue prenatal vitamins Problem list reviewed and updated Reviewed n/v relief measures and warning s/s to report Reviewed recommended weight gain based on pre-gravid BMI Encouraged well-balanced diet Genetic Screening discussed: declined Cystic fibrosis screening discussed declined Ultrasound discussed; fetal survey: requested Follow up in 4 weeks for visit [facility] completed Needs pap after 10/26 Smokes 1-2 cigarettes/day, advised cessation, discussed risks to fetus while pregnant, to infant pp, and to herself. Offered QuitlineNC, declined Discussed  situational depression- she feels she just needs to move out of the house she is in and everything would be fine. To let us know if anything worsens.      Marge DuncansBooker, Latoya [name] CNM, [facility]NP-BC 02/13/2017 10:53 AM

## 2017-02-13 NOTE — Patient Instructions (Signed)

## 2017-02-14 LAB — MED LIST OPTION NOT SELECTED

## 2017-02-14 LAB — GC/CHLAMYDIA PROBE AMP
CHLAMYDIA, DNA PROBE: NEGATIVE
NEISSERIA GONORRHOEAE BY PCR: NEGATIVE

## 2017-02-15 LAB — URINE CULTURE

## 2017-02-16 ENCOUNTER — Encounter: Payer: Self-pay | Admitting: Women's Health

## 2017-02-16 DIAGNOSIS — F1290 Cannabis use, unspecified, uncomplicated: Secondary | ICD-10-CM | POA: Insufficient documentation

## 2017-02-16 LAB — SICKLE CELL SCREEN: Sickle Cell Screen: NEGATIVE

## 2017-02-16 LAB — CBC
HEMATOCRIT: 37.8 % (ref 34.0–46.6)
HEMOGLOBIN: 12.7 g/dL (ref 11.1–15.9)
MCH: 30 pg (ref 26.6–33.0)
MCHC: 33.6 g/dL (ref 31.5–35.7)
MCV: 89 fL (ref 79–97)
Platelets: 292 10*3/uL (ref 150–379)
RBC: 4.23 x10E6/uL (ref 3.77–5.28)
RDW: 13 % (ref 12.3–15.4)
WBC: 9.9 10*3/uL (ref 3.4–10.8)

## 2017-02-16 LAB — URINALYSIS, ROUTINE W REFLEX MICROSCOPIC
Bilirubin, UA: NEGATIVE
GLUCOSE, UA: NEGATIVE
KETONES UA: NEGATIVE
LEUKOCYTES UA: NEGATIVE
NITRITE UA: NEGATIVE
Protein, UA: NEGATIVE
RBC, UA: NEGATIVE
Urobilinogen, Ur: 0.2 mg/dL (ref 0.2–1.0)
pH, UA: 6 (ref 5.0–7.5)

## 2017-02-16 LAB — PMP SCREEN PROFILE (10S), URINE
AMPHETAMINE SCREEN URINE: NEGATIVE ng/mL
BARBITURATE SCREEN URINE: NEGATIVE ng/mL
BENZODIAZEPINE SCREEN, URINE: NEGATIVE ng/mL
CANNABINOIDS UR QL SCN: POSITIVE ng/mL — AB
COCAINE(METAB.)SCREEN, URINE: NEGATIVE ng/mL
Creatinine(Crt), U: 226.4 mg/dL (ref 20.0–300.0)
Methadone Screen, Urine: NEGATIVE ng/mL
OXYCODONE+OXYMORPHONE UR QL SCN: NEGATIVE ng/mL
Opiate Scrn, Ur: NEGATIVE ng/mL
PHENCYCLIDINE QUANTITATIVE URINE: NEGATIVE ng/mL
Ph of Urine: 5.8 (ref 4.5–8.9)
Propoxyphene Scrn, Ur: NEGATIVE ng/mL

## 2017-02-16 LAB — VARICELLA ZOSTER ANTIBODY, IGG: VARICELLA: 3112 {index} (ref >165)

## 2017-02-16 LAB — RPR: RPR Ser Ql: NONREACTIVE

## 2017-02-16 LAB — HEPATITIS B SURFACE ANTIGEN: Hepatitis B Surface Ag: NEGATIVE

## 2017-02-16 LAB — HIV ANTIBODY (ROUTINE TESTING W REFLEX): HIV SCREEN 4TH GENERATION: NONREACTIVE

## 2017-02-16 LAB — ABO/RH: RH TYPE: POSITIVE

## 2017-02-16 LAB — ANTIBODY SCREEN: ANTIBODY SCREEN: NEGATIVE

## 2017-02-16 LAB — RUBELLA SCREEN: RUBELLA: 5.37 {index} (ref >0.99)

## 2017-03-12 ENCOUNTER — Encounter: Payer: Self-pay | Admitting: Women's Health

## 2017-03-12 ENCOUNTER — Ambulatory Visit (INDEPENDENT_AMBULATORY_CARE_PROVIDER_SITE_OTHER): Payer: Medicaid Other | Admitting: Women's Health

## 2017-03-12 VITALS — BP 112/50 | HR 87 | Wt 246.5 lb

## 2017-03-12 DIAGNOSIS — Z331 Pregnant state, incidental: Secondary | ICD-10-CM

## 2017-03-12 DIAGNOSIS — Z3482 Encounter for supervision of other normal pregnancy, second trimester: Secondary | ICD-10-CM

## 2017-03-12 DIAGNOSIS — Z1389 Encounter for screening for other disorder: Secondary | ICD-10-CM

## 2017-03-12 DIAGNOSIS — Z363 Encounter for antenatal screening for malformations: Secondary | ICD-10-CM

## 2017-03-12 DIAGNOSIS — Z3A14 14 weeks gestation of pregnancy: Secondary | ICD-10-CM

## 2017-03-12 LAB — POCT URINALYSIS DIPSTICK
Glucose, UA: NEGATIVE
KETONES UA: NEGATIVE
Leukocytes, UA: NEGATIVE
Nitrite, UA: NEGATIVE
PROTEIN UA: NEGATIVE

## 2017-03-12 NOTE — Progress Notes (Signed)
Low-risk OB appointment G3P1011 3526w3d Estimated Date of Delivery: 09/07/17 BP (!) 112/50   Pulse 87   Wt 246 lb 8 oz (111.8 kg)   LMP 12/01/2016 (Approximate)   BMI 39.79 kg/m   BP, weight, and urine reviewed.  Refer to obstetrical flow sheet for FH & FHR.  No fm yet. Denies cramping, lof, vb, or uti s/s. No complaints. Reviewed warning s/s to report. Plan:  Continue routine obstetrical care  F/U in 4wks for OB appointment and anatomy u/s Declines genetic screening

## 2017-03-12 NOTE — Patient Instructions (Signed)

## 2017-03-13 ENCOUNTER — Encounter: Payer: Medicaid Other | Admitting: Obstetrics and Gynecology

## 2017-04-09 ENCOUNTER — Ambulatory Visit (INDEPENDENT_AMBULATORY_CARE_PROVIDER_SITE_OTHER): Payer: Medicaid Other

## 2017-04-09 ENCOUNTER — Ambulatory Visit (INDEPENDENT_AMBULATORY_CARE_PROVIDER_SITE_OTHER): Payer: Medicaid Other | Admitting: Obstetrics & Gynecology

## 2017-04-09 ENCOUNTER — Encounter: Payer: Self-pay | Admitting: Obstetrics & Gynecology

## 2017-04-09 VITALS — BP 128/70 | HR 87 | Wt 251.0 lb

## 2017-04-09 DIAGNOSIS — Z3482 Encounter for supervision of other normal pregnancy, second trimester: Secondary | ICD-10-CM

## 2017-04-09 DIAGNOSIS — Z363 Encounter for antenatal screening for malformations: Secondary | ICD-10-CM

## 2017-04-09 DIAGNOSIS — Z331 Pregnant state, incidental: Secondary | ICD-10-CM

## 2017-04-09 DIAGNOSIS — Z1389 Encounter for screening for other disorder: Secondary | ICD-10-CM

## 2017-04-09 DIAGNOSIS — Z3A18 18 weeks gestation of pregnancy: Secondary | ICD-10-CM

## 2017-04-09 DIAGNOSIS — Z3402 Encounter for supervision of normal first pregnancy, second trimester: Secondary | ICD-10-CM

## 2017-04-09 LAB — POCT URINALYSIS DIPSTICK
Blood, UA: NEGATIVE
Glucose, UA: NEGATIVE
KETONES UA: NEGATIVE
LEUKOCYTES UA: NEGATIVE
NITRITE UA: NEGATIVE
PROTEIN UA: NEGATIVE

## 2017-04-09 NOTE — Progress Notes (Signed)
US 18+3 wks,cephalic,post pl gr 0,cx 5.6 cm,normal ovaries bilat,svp of fluid 4.6 cm,fhr 171 bpm,LVEICF 2.2 mm,EFW 265 g anatomy complete

## 2017-04-09 NOTE — Progress Notes (Signed)
[id number] 444w3d Estimated Date of Delivery: 09/07/17  Blood pressure 128/70, pulse 87, weight 251 lb (113.9 kg), last menstrual period 12/01/2016.   BP weight and urine results all reviewed and noted.  Please refer to the obstetrical flow sheet for the fundal height and fetal heart rate documentation:  Patient reports good fetal movement, denies any bleeding and no rupture of membranes symptoms or regular contractions. Patient is without complaints. All questions were answered.  Orders Placed This Encounter  Procedures  . POCT urinalysis dipstick    Plan:  Continued routine obstetrical care, see sonogram report  Isolated LV EICF, patient declines genetic testing , <2% inicidence of aneuploidy  No Follow-up on file.

## 2017-05-03 IMAGING — DX DG CHEST 2V
2 series · 2 of 2 positions shown · non-contrast
Comparison: Chest radiograph dated 05/10/2004

CLINICAL DATA: 25-year-old female with cough.

EXAM:
CHEST  2 VIEW

[chest pa]
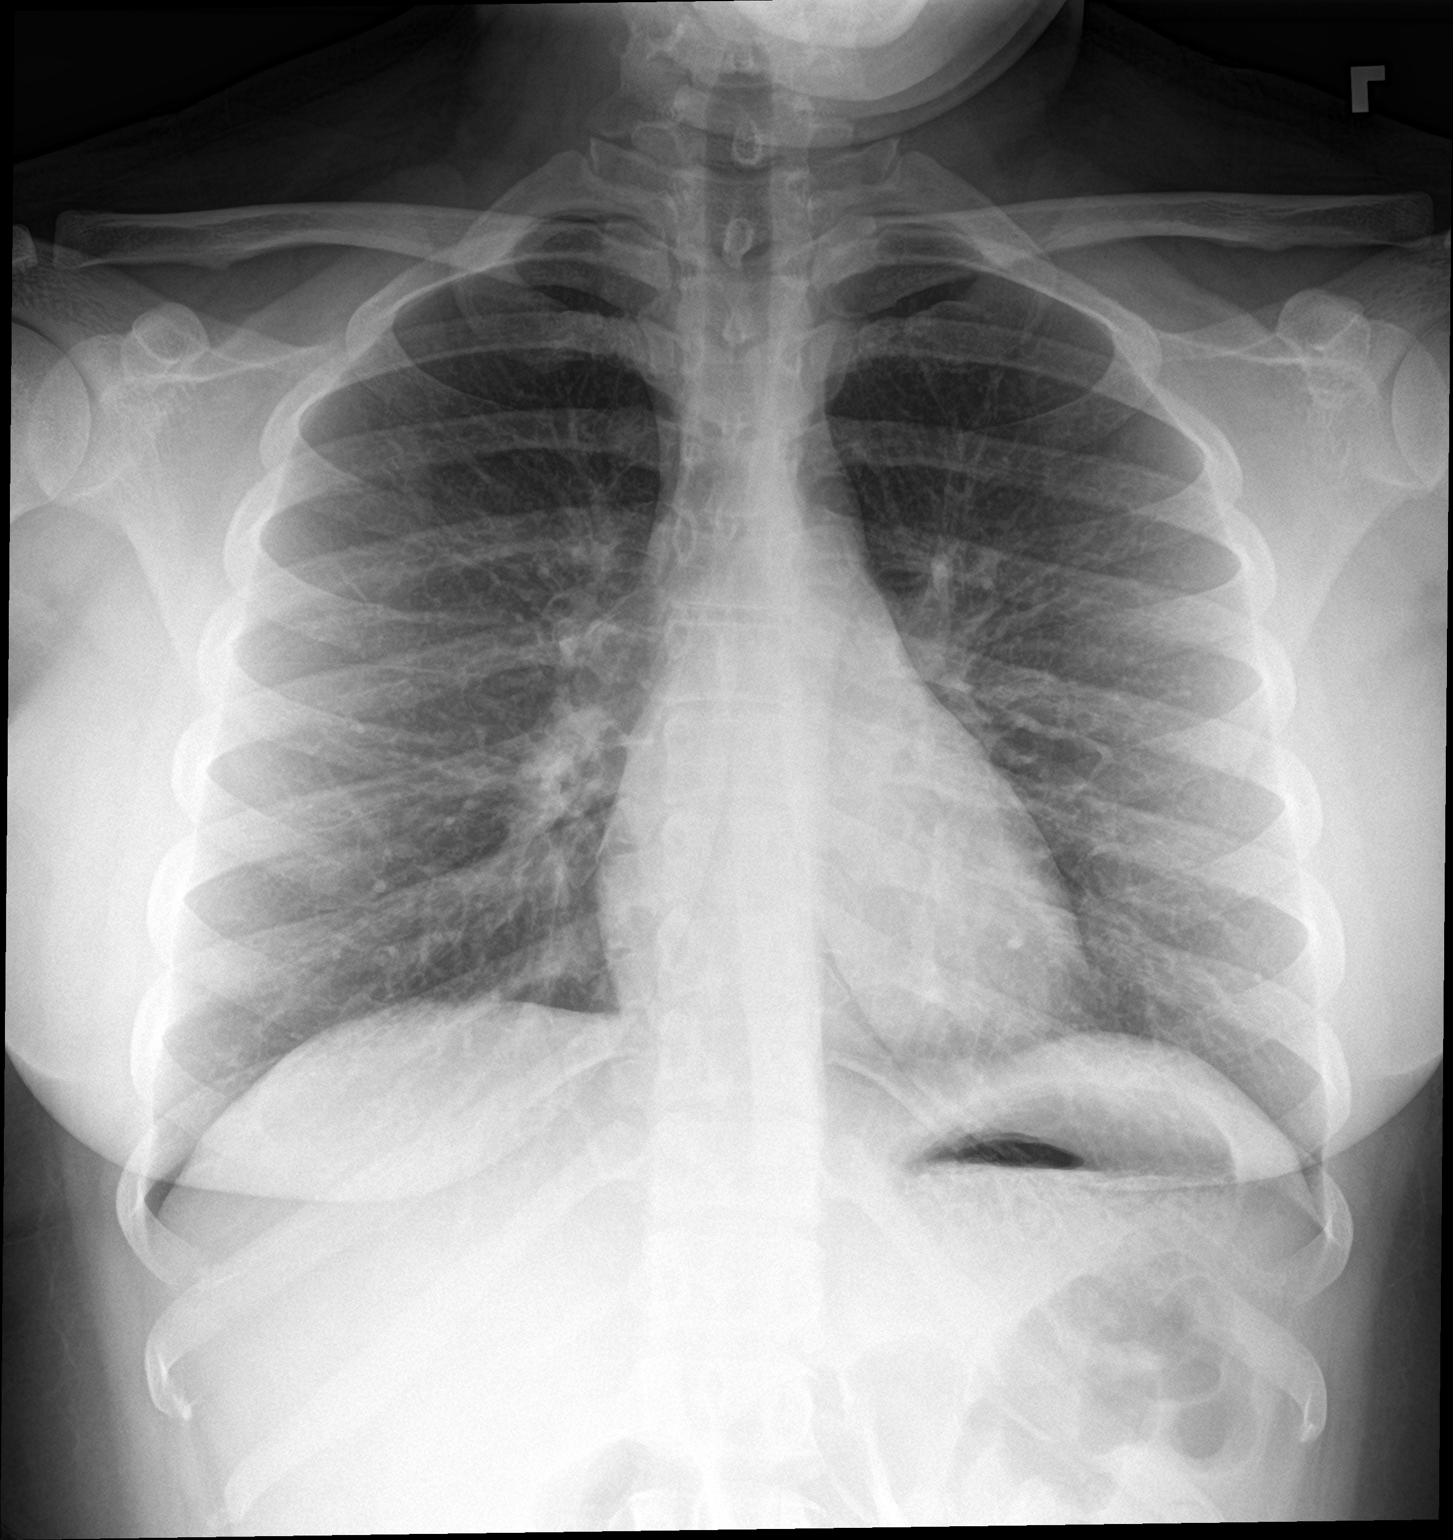

[chest lat]
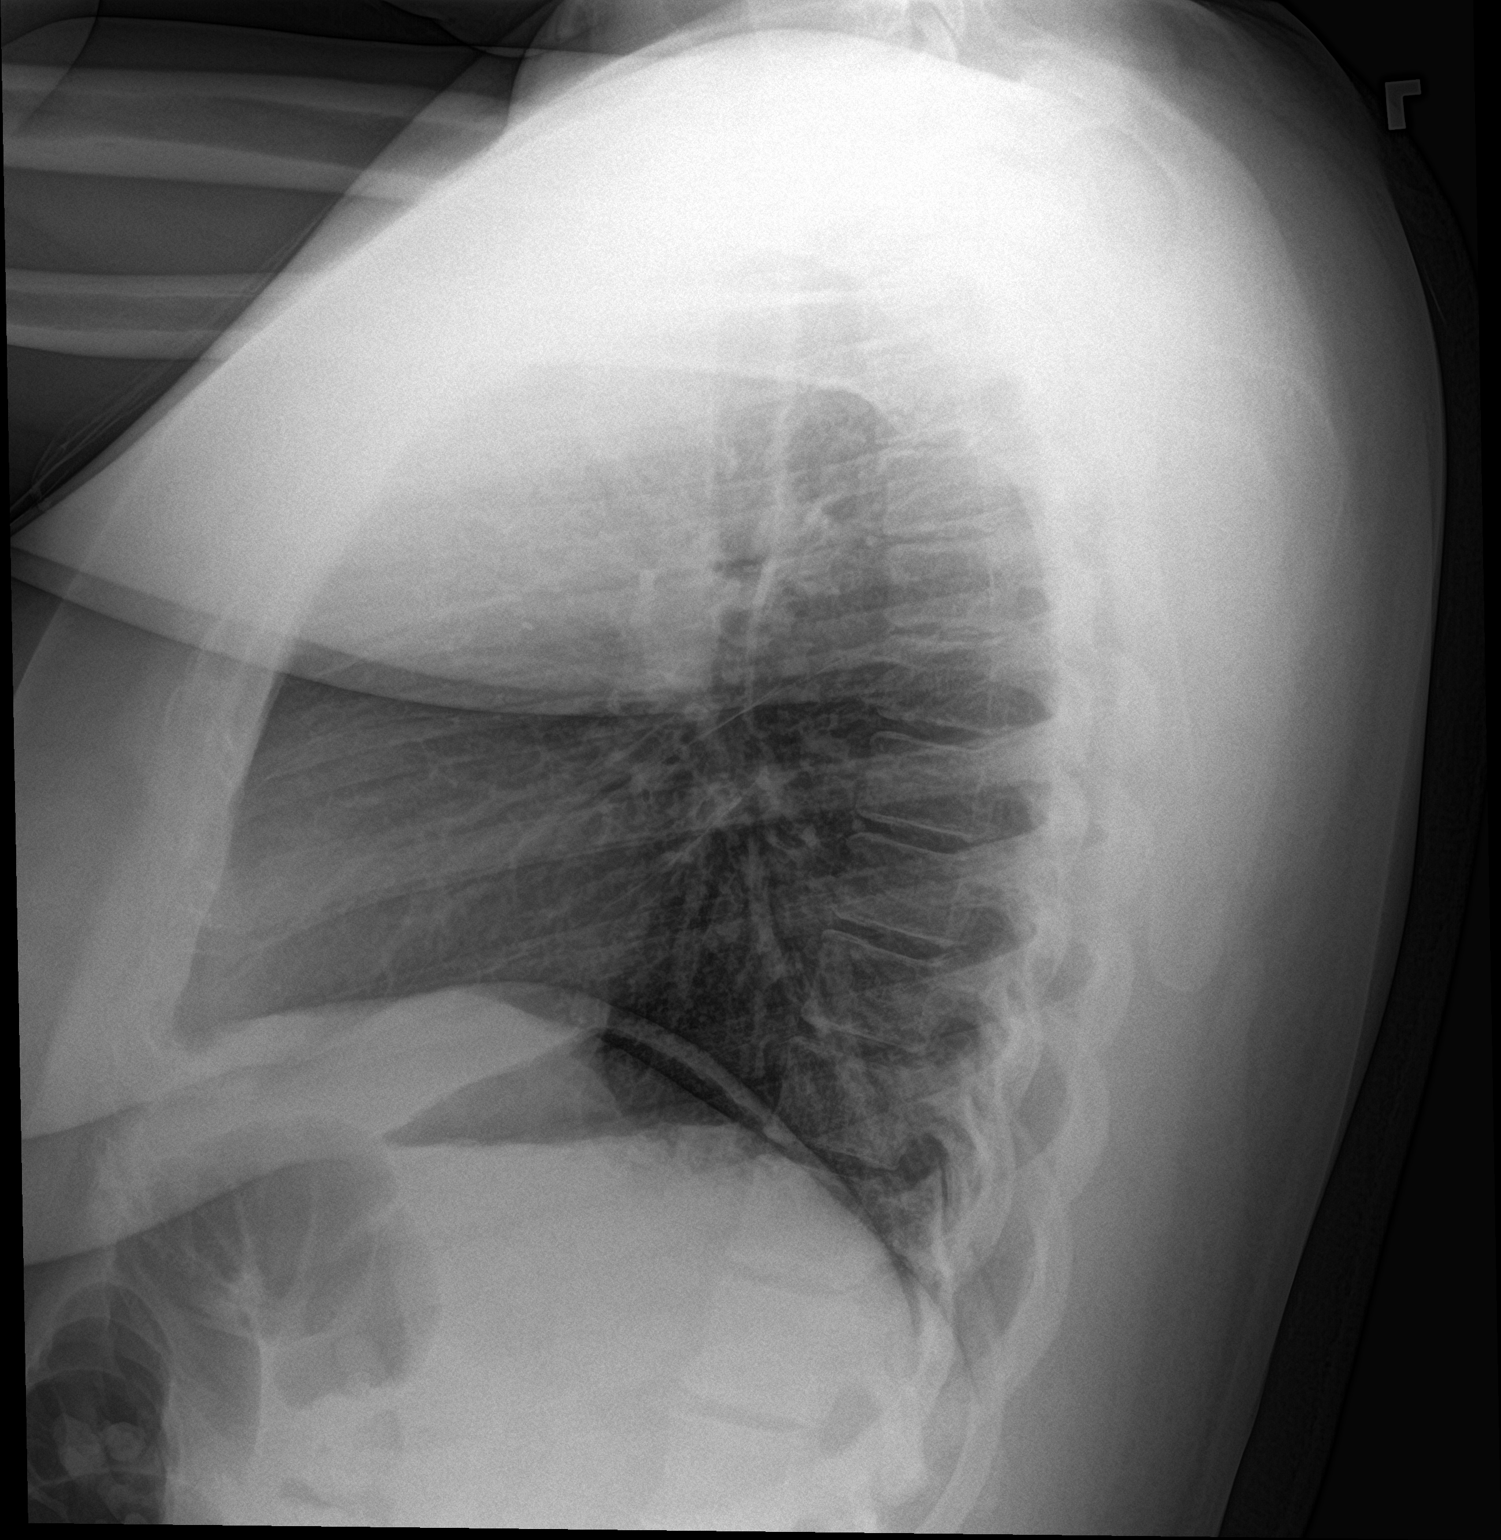

[2 of 2 positions shown; findings below may reference images not displayed]

FINDINGS: The heart size and mediastinal contours are within normal limits.
Both lungs are clear. The visualized skeletal structures are
unremarkable.
IMPRESSION: No active cardiopulmonary disease.

## 2017-05-07 ENCOUNTER — Ambulatory Visit (INDEPENDENT_AMBULATORY_CARE_PROVIDER_SITE_OTHER): Payer: Medicaid Other | Admitting: Obstetrics and Gynecology

## 2017-05-07 ENCOUNTER — Encounter: Payer: Self-pay | Admitting: Obstetrics and Gynecology

## 2017-05-07 VITALS — BP 100/60 | HR 76 | Wt 256.8 lb

## 2017-05-07 DIAGNOSIS — Z3402 Encounter for supervision of normal first pregnancy, second trimester: Secondary | ICD-10-CM

## 2017-05-07 DIAGNOSIS — Z3A22 22 weeks gestation of pregnancy: Secondary | ICD-10-CM

## 2017-05-07 DIAGNOSIS — Z331 Pregnant state, incidental: Secondary | ICD-10-CM

## 2017-05-07 DIAGNOSIS — Z1389 Encounter for screening for other disorder: Secondary | ICD-10-CM

## 2017-05-07 LAB — POCT URINALYSIS DIPSTICK
Glucose, UA: NEGATIVE
KETONES UA: NEGATIVE
Leukocytes, UA: NEGATIVE
Nitrite, UA: NEGATIVE
PROTEIN UA: NEGATIVE
RBC UA: NEGATIVE

## 2017-05-07 NOTE — Progress Notes (Signed)
Patient ID: Latoya [name], female   DOB: 05/16/1991, 26 y.o.   MRN: [id number]  Estimated Date of Delivery: 09/07/17 [facility] [redacted]w[redacted]d  Chief Complaint  Patient presents with  . Routine Prenatal Visit  ____  Patient complaints: Pt has been struggling with some family issues at home. Otherwise pt denies any other complaints at this time. Patient reports good fetal movement, denies any bleeding, rupture of membranes,or regular contractions.  Blood pressure 100/60, pulse 76, weight 256 lb 12.8 oz (116.5 kg), last menstrual period 12/01/2016.   Urine results:notable for negative refer to the ob flow sheet for FH and FHR, ,                          Physical Examination: General appearance - alert, well appearing, and in no distress and oriented to person, place, and time                                      Abdomen - FH 20 cm,                                                         -FHR 153 Questions were answered. Assessment: LROB G3P1011 @ [redacted]w[redacted]d   Plan:  Continued routine obstetrical care,  F/u in 4 weeks for LROB  By signing my name below, I, Diona Browner, attest that this documentation has been prepared under the direction and in the presence of Tilda Burrow, MD. Electronically Signed: Diona Browner, Medical Scribe. 05/07/17. 9:36 AM.  I personally performed the services described in this documentation, which was SCRIBED in my presence. The recorded information has been reviewed and considered accurate. It has been edited as necessary during review. Tilda Burrow, MD

## 2017-06-04 ENCOUNTER — Encounter: Payer: Self-pay | Admitting: Women's Health

## 2017-06-04 ENCOUNTER — Ambulatory Visit (INDEPENDENT_AMBULATORY_CARE_PROVIDER_SITE_OTHER): Payer: Medicaid Other | Admitting: Women's Health

## 2017-06-04 VITALS — BP 120/56 | HR 99 | Temp 98.2°F | Wt 264.5 lb

## 2017-06-04 DIAGNOSIS — Z331 Pregnant state, incidental: Secondary | ICD-10-CM

## 2017-06-04 DIAGNOSIS — R82998 Other abnormal findings in urine: Secondary | ICD-10-CM

## 2017-06-04 DIAGNOSIS — Z3482 Encounter for supervision of other normal pregnancy, second trimester: Secondary | ICD-10-CM

## 2017-06-04 DIAGNOSIS — O2602 Excessive weight gain in pregnancy, second trimester: Secondary | ICD-10-CM

## 2017-06-04 DIAGNOSIS — O283 Abnormal ultrasonic finding on antenatal screening of mother: Secondary | ICD-10-CM

## 2017-06-04 DIAGNOSIS — Z1389 Encounter for screening for other disorder: Secondary | ICD-10-CM

## 2017-06-04 DIAGNOSIS — Z3A26 26 weeks gestation of pregnancy: Secondary | ICD-10-CM

## 2017-06-04 LAB — POCT URINALYSIS DIPSTICK
Glucose, UA: NEGATIVE
Ketones, UA: NEGATIVE
Nitrite, UA: NEGATIVE
PROTEIN UA: NEGATIVE
RBC UA: NEGATIVE

## 2017-06-04 NOTE — Progress Notes (Signed)
LOW-RISK PREGNANCY VISIT Patient name: Latoya [name] MRN [id number]  Date of birth: Jul 26, 1991 Chief Complaint:   Routine Prenatal Visit (swelling in hands and feet; chest congestion )  History of Present Illness:   Latoya [name] is a 26 y.o. G95P1011 female at [redacted]w[redacted]d with an Estimated Date of Delivery: 09/07/17 being seen today for ongoing management of a low-risk pregnancy.  Today she reports chest congestion since last Dec! Went to urgent care few weeks ago- got rx for antibiotic, feels it has helped some, still has some congestion/productive cough. Denies any other sx, no fever/chills, sorethroat, ear pain, etc.  Reports she has quit smoking. Was not scheduled for pn2 today.  Contractions: Not present. Vag. Bleeding: None.  Movement: Present. denies leaking of fluid. Review of Systems:   Pertinent items are noted in HPI Denies abnormal vaginal discharge w/ itching/odor/irritation, headaches, visual changes, shortness of breath, chest pain, abdominal pain, severe nausea/vomiting, or problems with urination or bowel movements unless otherwise stated above. Pertinent History Reviewed:  Reviewed past medical,surgical, social, obstetrical and family history.  Reviewed problem list, medications and allergies. Physical Assessment:   Vitals:   06/04/17 0926  BP: (!) 120/56  Pulse: 99  Temp: 98.2 F (36.8 C)  Weight: 264 lb 8 oz (120 kg)  Body mass index is 42.69 kg/m.        Physical Examination:   General appearance: Well appearing, and in no distress  Mental status: Alert, oriented to person, place, and time  Skin: Warm & dry  Cardiovascular: Normal heart rate noted, HRRR  Respiratory: Normal respiratory effort, no distress, LCTAB  Abdomen: Soft, gravid, nontender  Pelvic: Cervical exam deferred         Extremities: Edema: Trace  Fetal Status: Fetal Heart Rate (bpm): 148 Fundal Height: 28 cm Movement: Present    Results for orders placed or performed in visit on 06/04/17  (from the past 24 hour(s))  POCT urinalysis dipstick   Collection Time: 06/04/17  9:28 AM  Result Value Ref Range   Color, UA     Clarity, UA     Glucose, UA neg    Bilirubin, UA     Ketones, UA neg    Spec Grav, UA  1.010 - 1.025   Blood, UA neg    pH, UA  5.0 - 8.0   Protein, UA neg    Urobilinogen, UA  0.2 or 1.0 E.U./dL   Nitrite, UA neg    Leukocytes, UA Moderate (2+) (A) Negative    Assessment & Plan:  1) Low-risk pregnancy G3P1011 at [redacted]w[redacted]d with an Estimated Date of Delivery: 09/07/17   2) Chest congestion/productive cough, lungs clear, no other sx, try mucinex/increased water intake  3) Excessive weight gain> 28lb to date of recommended no more than 20lb, 8lb since last visit. Admits to 'eating a lot'. Recommended decreasing carbs, increasing exercise/water intake, no more weight gain  4) Isolated Lt EICF> on anatomy u/s, declined genetic screening, will get f/u u/s next visit. Gave printed info.    Labs/procedures today: none. Declined flu shot. She was advised that the flu shot is recommended during pregnancy to help protect her and her baby. We discussed that it is an inactivated vaccine-so side effects are minimal, it is considered safe to receive during any trimester, and pregnant women are at a higher risk of developing potential complications from the flu, including death. She was given printed information from the CDC regarding the flu shot and the flu.  Plan:  Continue routine obstetrical care   Reviewed: Preterm labor symptoms and general obstetric precautions including but not limited to vaginal bleeding, contractions, leaking of fluid and fetal movement were reviewed in detail with the patient.  All questions were answered  Follow-up: Return for 2wks , LROB and pap, PN2, US:OB F/U EICF.  Orders Placed This Encounter  Procedures  . Urine Culture  . POCT urinalysis dipstick   Marge DuncansBooker, Neytiri Asche Randall CNM, [facility]NP-BC 06/04/2017 10:06 AM

## 2017-06-04 NOTE — Patient Instructions (Addendum)
You will have your sugar test next visit.  Please do not eat or drink anything after midnight the night before you come, not even water.  You will be here for at least two hours.     Mucinex for congestion, lots of water  Cut back on carbohydrates (breads, rice, pasta, potatoes, sweets, candies, sodas, etc), increase water intake, exercise  Call the office [phone]) or go to [facility] if:  You begin to have strong, frequent contractions  Your water breaks.  Sometimes it is a big gush of fluid, sometimes it is just a trickle that keeps getting your panties wet or running down your legs  You have vaginal bleeding.  It is normal to have a small amount of spotting if your cervix was checked.   You don't feel your baby moving like normal.  If you don't, get you something to eat and drink and lay down and focus on feeling your baby move.  You should feel at least 10 movements in 2 hours.  If you don't, you should call the office or go to [facility].    Tdap Vaccine  It is recommended that you get the Tdap vaccine during the third trimester of EACH pregnancy to help protect your baby from getting pertussis (whooping cough)  27-36 weeks is the BEST time to do this so that you can pass the protection on to your baby. During pregnancy is better than after pregnancy, but if you are unable to get it during pregnancy it will be offered at the hospital.   You can get this vaccine at the health department or your family doctor  Everyone who will be around your baby should also be up-to-date on their vaccines. Adults (who are not pregnant) only need 1 dose of Tdap during adulthood.   Third Trimester of Pregnancy The third trimester is from week 29 through week 42, months 7 through 9. The third trimester is a time when the fetus is growing rapidly. At the end of the ninth month, the fetus is about 20 inches in length and weighs 6-10 pounds.  BODY CHANGES Your body goes through many changes  during pregnancy. The changes vary from woman to woman.   Your weight will continue to increase. You can expect to gain 25-35 pounds (11-16 kg) by the end of the pregnancy.  You may begin to get stretch marks on your hips, abdomen, and breasts.  You may urinate more often because the fetus is moving lower into your pelvis and pressing on your bladder.  You may develop or continue to have heartburn as a result of your pregnancy.  You may develop constipation because certain hormones are causing the muscles that push waste through your intestines to slow down.  You may develop hemorrhoids or swollen, bulging veins (varicose veins).  You may have pelvic pain because of the weight gain and pregnancy hormones relaxing your joints between the bones in your pelvis. Backaches may result from overexertion of the muscles supporting your posture.  You may have changes in your hair. These can include thickening of your hair, rapid growth, and changes in texture. Some women also have hair loss during or after pregnancy, or hair that feels dry or thin. Your hair will most likely return to normal after your baby is born.  Your breasts will continue to grow and be tender. A yellow discharge may leak from your breasts called colostrum.  Your belly button may stick out.  You may feel short  of breath because of your expanding uterus.  You may notice the fetus "dropping," or moving lower in your abdomen.  You may have a bloody mucus discharge. This usually occurs a few days to a week before labor begins.  Your cervix becomes thin and soft (effaced) near your due date. WHAT TO EXPECT AT YOUR PRENATAL EXAMS  You will have prenatal exams every 2 weeks until week 36. Then, you will have weekly prenatal exams. During a routine prenatal visit:  You will be weighed to make sure you and the fetus are growing normally.  Your blood pressure is taken.  Your abdomen will be measured to track your baby's  growth.  The fetal heartbeat will be listened to.  Any test results from the previous visit will be discussed.  You may have a cervical check near your due date to see if you have effaced. At around 36 weeks, your caregiver will check your cervix. At the same time, your caregiver will also perform a test on the secretions of the vaginal tissue. This test is to determine if a type of bacteria, Group B streptococcus, is present. Your caregiver will explain this further. Your caregiver may ask you:  What your birth plan is.  How you are feeling.  If you are feeling the baby move.  If you have had any abnormal symptoms, such as leaking fluid, bleeding, severe headaches, or abdominal cramping.  If you have any questions. Other tests or screenings that may be performed during your third trimester include:  Blood tests that check for low iron levels (anemia).  Fetal testing to check the health, activity level, and growth of the fetus. Testing is done if you have certain medical conditions or if there are problems during the pregnancy. FALSE LABOR You may feel small, irregular contractions that eventually go away. These are called Braxton Hicks contractions, or false labor. Contractions may last for hours, days, or even weeks before true labor sets in. If contractions come at regular intervals, intensify, or become painful, it is best to be seen by your caregiver.  SIGNS OF LABOR   Menstrual-like cramps.  Contractions that are 5 minutes apart or less.  Contractions that start on the top of the uterus and spread down to the lower abdomen and back.  A sense of increased pelvic pressure or back pain.  A watery or bloody mucus discharge that comes from the vagina. If you have any of these signs before the 37th week of pregnancy, call your caregiver right away. You need to go to the hospital to get checked immediately. HOME CARE INSTRUCTIONS   Avoid all smoking, herbs, alcohol, and  unprescribed drugs. These chemicals affect the formation and growth of the baby.  Follow your caregiver's instructions regarding medicine use. There are medicines that are either safe or unsafe to take during pregnancy.  Exercise only as directed by your caregiver. Experiencing uterine cramps is a good sign to stop exercising.  Continue to eat regular, healthy meals.  Wear a good support bra for breast tenderness.  Do not use hot tubs, steam rooms, or saunas.  Wear your seat belt at all times when driving.  Avoid raw meat, uncooked cheese, cat litter boxes, and soil used by cats. These carry germs that can cause birth defects in the baby.  Take your prenatal vitamins.  Try taking a stool softener (if your caregiver approves) if you develop constipation. Eat more high-fiber foods, such as fresh vegetables or fruit and whole grains.  Drink plenty of fluids to keep your urine clear or pale yellow.  Take warm sitz baths to soothe any pain or discomfort caused by hemorrhoids. Use hemorrhoid cream if your caregiver approves.  If you develop varicose veins, wear support hose. Elevate your feet for 15 minutes, 3-4 times a day. Limit salt in your diet.  Avoid heavy lifting, wear low heal shoes, and practice good posture.  Rest a lot with your legs elevated if you have leg cramps or low back pain.  Visit your dentist if you have not gone during your pregnancy. Use a soft toothbrush to brush your teeth and be gentle when you floss.  A sexual relationship may be continued unless your caregiver directs you otherwise.  Do not travel far distances unless it is absolutely necessary and only with the approval of your caregiver.  Take prenatal classes to understand, practice, and ask questions about the labor and delivery.  Make a trial run to the hospital.  Pack your hospital bag.  Prepare the baby's nursery.  Continue to go to all your prenatal visits as directed by your caregiver. SEEK  MEDICAL CARE IF:  You are unsure if you are in labor or if your water has broken.  You have dizziness.  You have mild pelvic cramps, pelvic pressure, or nagging pain in your abdominal area.  You have persistent nausea, vomiting, or diarrhea.  You have a bad smelling vaginal discharge.  You have pain with urination. SEEK IMMEDIATE MEDICAL CARE IF:   You have a fever.  You are leaking fluid from your vagina.  You have spotting or bleeding from your vagina.  You have severe abdominal cramping or pain.  You have rapid weight loss or gain.  You have shortness of breath with chest pain.  You notice sudden or extreme swelling of your face, hands, ankles, feet, or legs.  You have not felt your baby move in over an hour.  You have severe headaches that do not go away with medicine.  You have vision changes. Document Released: 07/22/2001 Document Revised: 08/02/2013 Document Reviewed: 09/28/2012 [facility] Patient Information 2015 [place], [place]. This information is not intended to replace advice given to you by your health care provider. Make sure you discuss any questions you have with your health care provider.   PROTECT YOURSELF & YOUR BABY FROM THE FLU! Because you are pregnant, we at [facility], along with the Centers for Disease Control (CDC), recommend that you receive the flu vaccine to protect yourself and your baby from the flu. The flu is more likely to cause severe illness in pregnant women than in women of reproductive age who are not pregnant. Changes in the immune system, heart, and lungs during pregnancy make pregnant women (and women up to two weeks postpartum) more prone to severe illness from flu, including illness resulting in hospitalization. Flu also may be harmful for a pregnant woman's developing baby. A common flu symptom is fever, which may be associated with neural tube defects and other adverse outcomes for a developing baby. Getting vaccinated can also help  protect a baby after birth from flu. (Mom passes antibodies onto the developing baby during her pregnancy.)  A Flu Vaccine is the Best Protection Against Flu Getting a flu vaccine is the first and most important step in protecting against flu. Pregnant women should get a flu shot and not the live attenuated influenza vaccine (LAIV), also known as nasal spray flu vaccine. Flu vaccines given during pregnancy help protect both  the mother and her baby from flu. Vaccination has been shown to reduce the risk of flu-associated acute respiratory infection in pregnant women by up to one-half. A 2018 study showed that getting a flu shot reduced a pregnant woman's risk of being hospitalized with flu by an average of 40 percent. Pregnant women who get a flu vaccine are also helping to protect their babies from flu illness for the first several months after their birth, when they are too young to get vaccinated.   A Long Record of Safety for Flu Shots in Pregnant Women Flu shots have been given to millions of pregnant women over many years with a good safety record. There is a lot of evidence that flu vaccines can be given safely during pregnancy; though these data are limited for the first trimester. The CDC recommends that pregnant women get vaccinated during any trimester of their pregnancy. It is very important for pregnant women to get the flu shot.   Other Preventive Actions In addition to getting a flu shot, pregnant women should take the same everyday preventive actions the CDC recommends of everyone, including covering coughs, washing hands often, and avoiding people who are sick.  Symptoms and Treatment If you get sick with flu symptoms call your doctor right away. There are antiviral drugs that can treat flu illness and prevent serious flu complications. The CDC recommends prompt treatment for people who have influenza infection or suspected influenza infection and who are at high risk of serious flu  complications, such as people with asthma, diabetes (including gestational diabetes), or heart disease. Early treatment of influenza in hospitalized pregnant women has been shown to reduce the length of the hospital stay.  Symptoms Flu symptoms include fever, cough, sore throat, runny or stuffy nose, body aches, headache, chills and fatigue. Some people may also have vomiting and diarrhea. People may be infected with the flu and have respiratory symptoms without a fever.  Early Treatment is Important for Pregnant Women Treatment should begin as soon as possible because antiviral drugs work best when started early (within 48 hours after symptoms start). Antiviral drugs can make your flu illness milder and make you feel better faster. They may also prevent serious health problems that can result from flu illness. Oral oseltamivir (Tamiflu) is the preferred treatment for pregnant women because it has the most studies available to suggest that it is safe and beneficial. Antiviral drugs require a prescription from your provider. Having a fever caused by flu infection or other infections early in pregnancy may be linked to birth defects in a baby. In addition to taking antiviral drugs, pregnant women who get a fever should treat their fever with Tylenol (acetaminophen) and contact their provider immediately.  When to Seek Emergency Medical Care If you are pregnant and have any of these signs, seek care immediately:  Difficulty breathing or shortness of breath  Pain or pressure in the chest or abdomen  Sudden dizziness  Confusion  Severe or persistent vomiting  High fever that is not responding to Tylenol (or store brand equivalent)  Decreased or no movement of your baby  [email or web address]

## 2017-06-06 LAB — URINE CULTURE

## 2017-06-08 ENCOUNTER — Encounter: Payer: Self-pay | Admitting: Women's Health

## 2017-06-08 ENCOUNTER — Other Ambulatory Visit: Payer: Self-pay | Admitting: Women's Health

## 2017-06-08 ENCOUNTER — Telehealth: Payer: Self-pay | Admitting: *Deleted

## 2017-06-08 DIAGNOSIS — R8271 Bacteriuria: Secondary | ICD-10-CM | POA: Insufficient documentation

## 2017-06-08 DIAGNOSIS — Z3482 Encounter for supervision of other normal pregnancy, second trimester: Secondary | ICD-10-CM

## 2017-06-08 MED ORDER — AMPICILLIN 500 MG PO CAPS: 500.0000 mg | ORAL_CAPSULE | Freq: Four times a day (QID) | ORAL | 0 refills | Status: DC

## 2017-06-08 NOTE — Telephone Encounter (Signed)
Informed patient that urine showed GBS UTI and antibiotic was sent to pharmacy. Needs to take all of the medication and will need IV antibiotics every 4 hours during labor. All questions answered and pt verbalized understanding.

## 2017-06-18 ENCOUNTER — Ambulatory Visit (INDEPENDENT_AMBULATORY_CARE_PROVIDER_SITE_OTHER): Payer: Medicaid Other | Admitting: Women's Health

## 2017-06-18 ENCOUNTER — Other Ambulatory Visit (HOSPITAL_COMMUNITY)
Admission: RE | Admit: 2017-06-18 | Discharge: 2017-06-18 | Disposition: A | Discharge status: Home / Self Care | Payer: Medicaid Other | Source: Ambulatory Visit | Attending: Obstetrics & Gynecology | Admitting: Obstetrics & Gynecology

## 2017-06-18 ENCOUNTER — Ambulatory Visit (INDEPENDENT_AMBULATORY_CARE_PROVIDER_SITE_OTHER): Payer: Medicaid Other

## 2017-06-18 ENCOUNTER — Encounter: Payer: Self-pay | Admitting: Women's Health

## 2017-06-18 ENCOUNTER — Other Ambulatory Visit: Payer: Medicaid Other

## 2017-06-18 VITALS — BP 118/64 | HR 108 | Wt 266.0 lb

## 2017-06-18 DIAGNOSIS — Z3482 Encounter for supervision of other normal pregnancy, second trimester: Secondary | ICD-10-CM

## 2017-06-18 DIAGNOSIS — F1290 Cannabis use, unspecified, uncomplicated: Secondary | ICD-10-CM

## 2017-06-18 DIAGNOSIS — Z3402 Encounter for supervision of normal first pregnancy, second trimester: Secondary | ICD-10-CM

## 2017-06-18 DIAGNOSIS — O2342 Unspecified infection of urinary tract in pregnancy, second trimester: Secondary | ICD-10-CM

## 2017-06-18 DIAGNOSIS — Z124 Encounter for screening for malignant neoplasm of cervix: Secondary | ICD-10-CM | POA: Diagnosis not present

## 2017-06-18 DIAGNOSIS — Z3A28 28 weeks gestation of pregnancy: Secondary | ICD-10-CM

## 2017-06-18 DIAGNOSIS — Z3483 Encounter for supervision of other normal pregnancy, third trimester: Secondary | ICD-10-CM

## 2017-06-18 DIAGNOSIS — O283 Abnormal ultrasonic finding on antenatal screening of mother: Secondary | ICD-10-CM

## 2017-06-18 DIAGNOSIS — Z131 Encounter for screening for diabetes mellitus: Secondary | ICD-10-CM

## 2017-06-18 DIAGNOSIS — Z1389 Encounter for screening for other disorder: Secondary | ICD-10-CM

## 2017-06-18 DIAGNOSIS — Z331 Pregnant state, incidental: Secondary | ICD-10-CM

## 2017-06-18 LAB — POCT URINALYSIS DIPSTICK
Blood, UA: NEGATIVE
Glucose, UA: NEGATIVE
Nitrite, UA: NEGATIVE
PROTEIN UA: NEGATIVE

## 2017-06-18 MED ORDER — TERCONAZOLE 0.4 % VA CREA: 1.0000 | TOPICAL_CREAM | Freq: Every day | VAGINAL | 0 refills | Status: DC

## 2017-06-18 MED ORDER — PRENATAL VITAMIN 27-0.8 MG PO TABS: 1.0000 | ORAL_TABLET | Freq: Every day | ORAL | 11 refills | Status: DC

## 2017-06-18 NOTE — Patient Instructions (Signed)
Latoya [name], I greatly value your feedback.  If you receive a survey following your visit with us today, we appreciate you taking the time to fill it out.  Thanks, Latoya [name], CNM, WHNP-BC   Call the office [phone]) or go to [facility] if:  You begin to have strong, frequent contractions  Your water breaks.  Sometimes it is a big gush of fluid, sometimes it is just a trickle that keeps getting your panties wet or running down your legs  You have vaginal bleeding.  It is normal to have a small amount of spotting if your cervix was checked.   You don't feel your baby moving like normal.  If you don't, get you something to eat and drink and lay down and focus on feeling your baby move.  You should feel at least 10 movements in 2 hours.  If you don't, you should call the office or go to [facility].    Tdap Vaccine  It is recommended that you get the Tdap vaccine during the third trimester of EACH pregnancy to help protect your baby from getting pertussis (whooping cough)  27-36 weeks is the BEST time to do this so that you can pass the protection on to your baby. During pregnancy is better than after pregnancy, but if you are unable to get it during pregnancy it will be offered at the hospital.   You can get this vaccine at the health department or your family doctor  Everyone who will be around your baby should also be up-to-date on their vaccines. Adults (who are not pregnant) only need 1 dose of Tdap during adulthood.   Third Trimester of Pregnancy The third trimester is from week 29 through week 42, months 7 through 9. The third trimester is a time when the fetus is growing rapidly. At the end of the ninth month, the fetus is about 20 inches in length and weighs 6-10 pounds.  BODY CHANGES Your body goes through many changes during pregnancy. The changes vary from woman to woman.   Your weight will continue to increase. You can expect to gain 25-35 pounds (11-16 kg)  by the end of the pregnancy.  You may begin to get stretch marks on your hips, abdomen, and breasts.  You may urinate more often because the fetus is moving lower into your pelvis and pressing on your bladder.  You may develop or continue to have heartburn as a result of your pregnancy.  You may develop constipation because certain hormones are causing the muscles that push waste through your intestines to slow down.  You may develop hemorrhoids or swollen, bulging veins (varicose veins).  You may have pelvic pain because of the weight gain and pregnancy hormones relaxing your joints between the bones in your pelvis. Backaches may result from overexertion of the muscles supporting your posture.  You may have changes in your hair. These can include thickening of your hair, rapid growth, and changes in texture. Some women also have hair loss during or after pregnancy, or hair that feels dry or thin. Your hair will most likely return to normal after your baby is born.  Your breasts will continue to grow and be tender. A yellow discharge may leak from your breasts called colostrum.  Your belly button may stick out.  You may feel short of breath because of your expanding uterus.  You may notice the fetus "dropping," or moving lower in your abdomen.  You may have a bloody  mucus discharge. This usually occurs a few days to a week before labor begins.  Your cervix becomes thin and soft (effaced) near your due date. WHAT TO EXPECT AT YOUR PRENATAL EXAMS  You will have prenatal exams every 2 weeks until week 36. Then, you will have weekly prenatal exams. During a routine prenatal visit:  You will be weighed to make sure you and the fetus are growing normally.  Your blood pressure is taken.  Your abdomen will be measured to track your baby's growth.  The fetal heartbeat will be listened to.  Any test results from the previous visit will be discussed.  You may have a cervical check near  your due date to see if you have effaced. At around 36 weeks, your caregiver will check your cervix. At the same time, your caregiver will also perform a test on the secretions of the vaginal tissue. This test is to determine if a type of bacteria, Group B streptococcus, is present. Your caregiver will explain this further. Your caregiver may ask you:  What your birth plan is.  How you are feeling.  If you are feeling the baby move.  If you have had any abnormal symptoms, such as leaking fluid, bleeding, severe headaches, or abdominal cramping.  If you have any questions. Other tests or screenings that may be performed during your third trimester include:  Blood tests that check for low iron levels (anemia).  Fetal testing to check the health, activity level, and growth of the fetus. Testing is done if you have certain medical conditions or if there are problems during the pregnancy. FALSE LABOR You may feel small, irregular contractions that eventually go away. These are called Braxton Hicks contractions, or false labor. Contractions may last for hours, days, or even weeks before true labor sets in. If contractions come at regular intervals, intensify, or become painful, it is best to be seen by your caregiver.  SIGNS OF LABOR   Menstrual-like cramps.  Contractions that are 5 minutes apart or less.  Contractions that start on the top of the uterus and spread down to the lower abdomen and back.  A sense of increased pelvic pressure or back pain.  A watery or bloody mucus discharge that comes from the vagina. If you have any of these signs before the 37th week of pregnancy, call your caregiver right away. You need to go to the hospital to get checked immediately. HOME CARE INSTRUCTIONS   Avoid all smoking, herbs, alcohol, and unprescribed drugs. These chemicals affect the formation and growth of the baby.  Follow your caregiver's instructions regarding medicine use. There are  medicines that are either safe or unsafe to take during pregnancy.  Exercise only as directed by your caregiver. Experiencing uterine cramps is a good sign to stop exercising.  Continue to eat regular, healthy meals.  Wear a good support bra for breast tenderness.  Do not use hot tubs, steam rooms, or saunas.  Wear your seat belt at all times when driving.  Avoid raw meat, uncooked cheese, cat litter boxes, and soil used by cats. These carry germs that can cause birth defects in the baby.  Take your prenatal vitamins.  Try taking a stool softener (if your caregiver approves) if you develop constipation. Eat more high-fiber foods, such as fresh vegetables or fruit and whole grains. Drink plenty of fluids to keep your urine clear or pale yellow.  Take warm sitz baths to soothe any pain or discomfort caused by hemorrhoids.  Use hemorrhoid cream if your caregiver approves.  If you develop varicose veins, wear support hose. Elevate your feet for 15 minutes, 3-4 times a day. Limit salt in your diet.  Avoid heavy lifting, wear low heal shoes, and practice good posture.  Rest a lot with your legs elevated if you have leg cramps or low back pain.  Visit your dentist if you have not gone during your pregnancy. Use a soft toothbrush to brush your teeth and be gentle when you floss.  A sexual relationship may be continued unless your caregiver directs you otherwise.  Do not travel far distances unless it is absolutely necessary and only with the approval of your caregiver.  Take prenatal classes to understand, practice, and ask questions about the labor and delivery.  Make a trial run to the hospital.  Pack your hospital bag.  Prepare the baby's nursery.  Continue to go to all your prenatal visits as directed by your caregiver. SEEK MEDICAL CARE IF:  You are unsure if you are in labor or if your water has broken.  You have dizziness.  You have mild pelvic cramps, pelvic pressure, or  nagging pain in your abdominal area.  You have persistent nausea, vomiting, or diarrhea.  You have a bad smelling vaginal discharge.  You have pain with urination. SEEK IMMEDIATE MEDICAL CARE IF:   You have a fever.  You are leaking fluid from your vagina.  You have spotting or bleeding from your vagina.  You have severe abdominal cramping or pain.  You have rapid weight loss or gain.  You have shortness of breath with chest pain.  You notice sudden or extreme swelling of your face, hands, ankles, feet, or legs.  You have not felt your baby move in over an hour.  You have severe headaches that do not go away with medicine.  You have vision changes. Document Released: 07/22/2001 Document Revised: 08/02/2013 Document Reviewed: 09/28/2012 [facility] Patient Information 2015 [place], [place]. This information is not intended to replace advice given to you by your health care provider. Make sure you discuss any questions you have with your health care provider.

## 2017-06-18 NOTE — Progress Notes (Signed)
US 28+3 wks,cephalic,cx 3.8 cm,post pl gr 0,normal ovaries bilat,resolved LVEICF,afi 15 cm,fhr 138 bpm,EFW 1392 67 %

## 2017-06-18 NOTE — Progress Notes (Addendum)
LOW-RISK PREGNANCY VISIT Patient name: Latoya [name] MRN [id number]  Date of birth: 07/18/1991 Chief Complaint:   Routine Prenatal Visit (PN2 & U/S today; needs PNV refill)  History of Present Illness:   Latoya [name] is a 26 y.o. 213P1011 female at 3363w3d with an Estimated Date of Delivery: 09/07/17 being seen today for ongoing management of a low-risk pregnancy.  Today she reports vulvar itching w/ thick clumpy d/c x few weeks. Finished all meds for GBS uti. THC+ earlier pregnancy, none since +PT. H/O abnormal pap 06/05/16, needs repeat.  Contractions: Not present. Vag. Bleeding: None.  Movement: Present. denies leaking of fluid. Review of Systems:   Pertinent items are noted in HPI Denies abnormal vaginal discharge w/ itching/odor/irritation, headaches, visual changes, shortness of breath, chest pain, abdominal pain, severe nausea/vomiting, or problems with urination or bowel movements unless otherwise stated above. Pertinent History Reviewed:  Reviewed past medical,surgical, social, obstetrical and family history.  Reviewed problem list, medications and allergies. Physical Assessment:   Vitals:   06/18/17 1058  BP: 118/64  Pulse: (!) 108  Weight: 266 lb (120.7 kg)  Body mass index is 42.93 kg/m.        Physical Examination:   General appearance: Well appearing, and in no distress  Mental status: Alert, oriented to person, place, and time  Skin: Warm & dry  Cardiovascular: Normal heart rate noted  Respiratory: Normal respiratory effort, no distress  Abdomen: Soft, gravid, nontender  Pelvic: cx visually closed, thin prep pap obtained, large amt thick clumpy white/yellow d/c c/w yeast, no odor- wet prep not done         Extremities: Edema: Trace  Fetal Status: Fetal Heart Rate (bpm): 138 u/s Fundal Height: 29 cm Movement: Present     Today's f/u u/s for EICF w/o genetic screening: US 28+3 wks,cephalic,cx 3.8 cm,post pl gr 0,normal ovaries bilat,resolved LVEICF,afi 15  cm,fhr 138 bpm,EFW 1392 67 %  Results for orders placed or performed in visit on 06/18/17 (from the past 24 hour(s))  POCT urinalysis dipstick   Collection Time: 06/18/17 11:06 AM  Result Value Ref Range   Color, UA     Clarity, UA     Glucose, UA neg    Bilirubin, UA     Ketones, UA mod    Spec Grav, UA  1.010 - 1.025   Blood, UA neg    pH, UA  5.0 - 8.0   Protein, UA neg    Urobilinogen, UA  0.2 or 1.0 E.U./dL   Nitrite, UA neg    Leukocytes, UA Moderate (2+) (A) Negative    Assessment & Plan:  1) Low-risk pregnancy G3P1011 at 4463w3d with an Estimated Date of Delivery: 09/07/17   2) H/O abnormal pap 4538yr ago> repeated today  3) Vulvovaginal candida> rx terazol 7, no sex while using  4) GBS bacteruria last visit> finished meds, POC today  5) THC+ earlier pregnancy> repeat today  6) Isolated EICF on anatomy u/s, declined genetic screening> u/s today reveals EICF resolved   Labs/procedures today: pap, u/s, pn2, urine cx  Plan:  Continue routine obstetrical care   Reviewed: Preterm labor symptoms and general obstetric precautions including but not limited to vaginal bleeding, contractions, leaking of fluid and fetal movement were reviewed in detail with the patient.  Recommended Tdap at HD/PCP per CDC guidelines. All questions were answered  Follow-up: Return in about 4 weeks (around 07/16/2017) for LROB.  Orders Placed This Encounter  Procedures  . Urine Culture  .  Pain Management Screening Profile (10S)  . POCT urinalysis dipstick   Marge DuncansBooker, Barnell Shieh Randall CNM, [facility] 06/18/2017 12:30 PM

## 2017-06-19 LAB — PMP SCREEN PROFILE (10S), URINE
Amphetamine Scrn, Ur: NEGATIVE ng/mL
BARBITURATE SCREEN URINE: NEGATIVE ng/mL
BENZODIAZEPINE SCREEN, URINE: NEGATIVE ng/mL
CANNABINOIDS UR QL SCN: NEGATIVE ng/mL
Cocaine (Metab) Scrn, Ur: NEGATIVE ng/mL
Creatinine(Crt), U: 191.6 mg/dL (ref 20.0–300.0)
Methadone Screen, Urine: NEGATIVE ng/mL
OPIATE SCREEN URINE: NEGATIVE ng/mL
OXYCODONE+OXYMORPHONE UR QL SCN: NEGATIVE ng/mL
PHENCYCLIDINE QUANTITATIVE URINE: NEGATIVE ng/mL
Ph of Urine: 5.8 (ref 4.5–8.9)
Propoxyphene Scrn, Ur: NEGATIVE ng/mL

## 2017-06-19 LAB — ANTIBODY SCREEN: Antibody Screen: NEGATIVE

## 2017-06-19 LAB — CBC
Hematocrit: 37 % (ref 34.0–46.6)
Hemoglobin: 12.4 g/dL (ref 11.1–15.9)
MCH: 30.2 pg (ref 26.6–33.0)
MCHC: 33.5 g/dL (ref 31.5–35.7)
MCV: 90 fL (ref 79–97)
PLATELETS: 288 10*3/uL (ref 150–379)
RBC: 4.11 x10E6/uL (ref 3.77–5.28)
RDW: 13.4 % (ref 12.3–15.4)
WBC: 18.2 10*3/uL — ABNORMAL HIGH (ref 3.4–10.8)

## 2017-06-19 LAB — HIV ANTIBODY (ROUTINE TESTING W REFLEX): HIV SCREEN 4TH GENERATION: NONREACTIVE

## 2017-06-19 LAB — GLUCOSE TOLERANCE, 2 HOURS W/ 1HR
GLUCOSE, FASTING: 72 mg/dL (ref 65–91)
Glucose, 1 hour: 145 mg/dL (ref 65–179)
Glucose, 2 hour: 114 mg/dL (ref 65–152)

## 2017-06-19 LAB — RPR: RPR Ser Ql: NONREACTIVE

## 2017-06-20 LAB — URINE CULTURE

## 2017-06-22 ENCOUNTER — Other Ambulatory Visit: Payer: Self-pay | Admitting: Women's Health

## 2017-06-22 ENCOUNTER — Telehealth: Payer: Self-pay | Admitting: *Deleted

## 2017-06-22 DIAGNOSIS — R8271 Bacteriuria: Secondary | ICD-10-CM

## 2017-06-22 MED ORDER — AMOXICILLIN 500 MG PO CAPS
500.0000 mg | ORAL_CAPSULE | Freq: Two times a day (BID) | ORAL | 0 refills | Status: DC
Start: 2017-06-22 — End: 2017-07-16

## 2017-06-22 NOTE — Telephone Encounter (Signed)
LMOVM that pt still has a uti. Informed that prescription for amoxicillin was sent to her pharmacy and she should take all as directed.

## 2017-06-23 LAB — CYTOLOGY - PAP
CHLAMYDIA, DNA PROBE: NEGATIVE
Diagnosis: NEGATIVE
NEISSERIA GONORRHEA: NEGATIVE

## 2017-07-13 ENCOUNTER — Other Ambulatory Visit: Payer: Medicaid Other | Admitting: Women's Health

## 2017-07-16 ENCOUNTER — Ambulatory Visit (INDEPENDENT_AMBULATORY_CARE_PROVIDER_SITE_OTHER): Payer: Medicaid Other | Admitting: Obstetrics and Gynecology

## 2017-07-16 ENCOUNTER — Encounter: Payer: Self-pay | Admitting: Obstetrics and Gynecology

## 2017-07-16 VITALS — BP 138/58 | HR 93 | Wt 273.0 lb

## 2017-07-16 DIAGNOSIS — Z87440 Personal history of urinary (tract) infections: Secondary | ICD-10-CM

## 2017-07-16 DIAGNOSIS — Z1389 Encounter for screening for other disorder: Secondary | ICD-10-CM

## 2017-07-16 DIAGNOSIS — Z3483 Encounter for supervision of other normal pregnancy, third trimester: Secondary | ICD-10-CM

## 2017-07-16 DIAGNOSIS — Z3A32 32 weeks gestation of pregnancy: Secondary | ICD-10-CM

## 2017-07-16 DIAGNOSIS — Z331 Pregnant state, incidental: Secondary | ICD-10-CM

## 2017-07-16 LAB — POCT URINALYSIS DIPSTICK
Blood, UA: NEGATIVE
Glucose, UA: NEGATIVE
Ketones, UA: NEGATIVE
Leukocytes, UA: NEGATIVE
Nitrite, UA: NEGATIVE
Protein, UA: NEGATIVE

## 2017-07-16 NOTE — Progress Notes (Signed)
[id number]  [facility]  Chief Complaint  Patient presents with  . Routine Prenatal Visit  ____  Patient complaints: Decreased energy with the weight gain of pregnancy currently 276, 38 pound weight gain. Patient reports   good fetal movement,                           denies any bleeding , rupture of membranes,or regular contractions.  Blood pressure (!) 138/58, pulse 93, weight 273 lb (123.8 kg), last menstrual period 12/01/2016.   Urine results:notable for negative protein refer to the ob flow sheet for FH and FHR, ,                          Physical Examination: General appearance - alert, well appearing, and in no distress and overweight                                      Abdomen - FH 34 ,                                                         -FHR 134                                                                                               Pelvic -not required                                            Questions were answered. Assessment: LROB G3P1011 @ 5434w3d                         Body mass index is 44.06 kg/m.              Plan:  Continued routine obstetrical care,   F/u in 2 weeks for lrob

## 2017-07-17 ENCOUNTER — Encounter: Payer: Medicaid Other | Admitting: Obstetrics & Gynecology

## 2017-07-18 LAB — URINE CULTURE

## 2017-07-30 ENCOUNTER — Encounter: Payer: Self-pay | Admitting: Women's Health

## 2017-07-30 ENCOUNTER — Ambulatory Visit (INDEPENDENT_AMBULATORY_CARE_PROVIDER_SITE_OTHER): Payer: Medicaid Other | Admitting: Women's Health

## 2017-07-30 VITALS — BP 130/66 | HR 102 | Wt 276.0 lb

## 2017-07-30 DIAGNOSIS — Z331 Pregnant state, incidental: Secondary | ICD-10-CM

## 2017-07-30 DIAGNOSIS — Z1389 Encounter for screening for other disorder: Secondary | ICD-10-CM

## 2017-07-30 DIAGNOSIS — Z3A34 34 weeks gestation of pregnancy: Secondary | ICD-10-CM

## 2017-07-30 DIAGNOSIS — Z3483 Encounter for supervision of other normal pregnancy, third trimester: Secondary | ICD-10-CM

## 2017-07-30 LAB — POCT URINALYSIS DIPSTICK
GLUCOSE UA: NEGATIVE
KETONES UA: NEGATIVE
Leukocytes, UA: NEGATIVE
Nitrite, UA: NEGATIVE
Protein, UA: NEGATIVE
RBC UA: NEGATIVE

## 2017-07-30 NOTE — Progress Notes (Signed)
   LOW-RISK PREGNANCY VISIT Patient name: Latoya [name] MRN [id number]  Date of birth: 08/08/1991 Chief Complaint:   Routine Prenatal Visit  History of Present Illness:   Latoya [name] is a 26 y.o. 173P1011 female at 7549w3d with an Estimated Date of Delivery: 09/07/17 being seen today for ongoing management of a low-risk pregnancy.  Today she reports no complaints. Contractions: Not present. Vag. Bleeding: None.  Movement: Present. denies leaking of fluid. Review of Systems:   Pertinent items are noted in HPI Denies abnormal vaginal discharge w/ itching/odor/irritation, headaches, visual changes, shortness of breath, chest pain, abdominal pain, severe nausea/vomiting, or problems with urination or bowel movements unless otherwise stated above. Pertinent History Reviewed:  Reviewed past medical,surgical, social, obstetrical and family history.  Reviewed problem list, medications and allergies. Physical Assessment:   Vitals:   07/30/17 1019  BP: 130/66  Pulse: (!) 102  Weight: 276 lb (125.2 kg)  Body mass index is 44.55 kg/m.        Physical Examination:   General appearance: Well appearing, and in no distress  Mental status: Alert, oriented to person, place, and time  Skin: Warm & dry  Cardiovascular: Normal heart rate noted  Respiratory: Normal respiratory effort, no distress  Abdomen: Soft, gravid, nontender  Pelvic: Cervical exam deferred         Extremities: Edema: Trace  Fetal Status: Fetal Heart Rate (bpm): 150 Fundal Height: 36 cm Movement: Present    Results for orders placed or performed in visit on 07/30/17 (from the past 24 hour(s))  POCT Urinalysis Dipstick   Collection Time: 07/30/17 10:22 AM  Result Value Ref Range   Color, UA     Clarity, UA     Glucose, UA neg    Bilirubin, UA     Ketones, UA neg    Spec Grav, UA  1.010 - 1.025   Blood, UA neg    pH, UA  5.0 - 8.0   Protein, UA neg    Urobilinogen, UA  0.2 or 1.0 E.U./dL   Nitrite, UA neg    Leukocytes, UA Negative Negative   Appearance     Odor      Assessment & Plan:  1) Low-risk pregnancy G3P1011 at 349w3d with an Estimated Date of Delivery: 09/07/17    Labs/procedures today: none  Plan:  Continue routine obstetrical care   Reviewed: Preterm labor symptoms and general obstetric precautions including but not limited to vaginal bleeding, contractions, leaking of fluid and fetal movement were reviewed in detail with the patient.  All questions were answered  Follow-up: Return in about 2 weeks (around 08/13/2017) for LROB.  Orders Placed This Encounter  Procedures  . POCT Urinalysis Dipstick   Marge DuncansBooker, Geanette Buonocore Randall CNM, [facility] 07/30/2017 10:43 AM

## 2017-07-30 NOTE — Patient Instructions (Signed)
Latoya [name], I greatly value your feedback.  If you receive a survey following your visit with us today, we appreciate you taking the time to fill it out.  Thanks, Joellyn HaffKim Tamanna Whitson, CNM, WHNP-BC   Call the office [phone]) or go to [facility] if:  You begin to have strong, frequent contractions  Your water breaks.  Sometimes it is a big gush of fluid, sometimes it is just a trickle that keeps getting your panties wet or running down your legs  You have vaginal bleeding.  It is normal to have a small amount of spotting if your cervix was checked.   You don't feel your baby moving like normal.  If you don't, get you something to eat and drink and lay down and focus on feeling your baby move.  You should feel at least 10 movements in 2 hours.  If you don't, you should call the office or go to [facility].     Preterm Labor and Birth Information The normal length of a pregnancy is 39-41 weeks. Preterm labor is when labor starts before 37 completed weeks of pregnancy. What are the risk factors for preterm labor? Preterm labor is more likely to occur in women who:  Have certain infections during pregnancy such as a bladder infection, sexually transmitted infection, or infection inside the uterus (chorioamnionitis).  Have a shorter-than-normal cervix.  Have gone into preterm labor before.  Have had surgery on their cervix.  Are younger than age 24 or older than age 24.  Are African American.  Are pregnant with twins or multiple babies (multiple gestation).  Take street drugs or smoke while pregnant.  Do not gain enough weight while pregnant.  Became pregnant shortly after having been pregnant.  What are the symptoms of preterm labor? Symptoms of preterm labor include:  Cramps similar to those that can happen during a menstrual period. The cramps may happen with diarrhea.  Pain in the abdomen or lower back.  Regular uterine contractions that may feel like  tightening of the abdomen.  A feeling of increased pressure in the pelvis.  Increased watery or bloody mucus discharge from the vagina.  Water breaking (ruptured amniotic sac).  Why is it important to recognize signs of preterm labor? It is important to recognize signs of preterm labor because babies who are born prematurely may not be fully developed. This can put them at an increased risk for:  Long-term (chronic) heart and lung problems.  Difficulty immediately after birth with regulating body systems, including blood sugar, body temperature, heart rate, and breathing rate.  Bleeding in the brain.  Cerebral palsy.  Learning difficulties.  Death.  These risks are highest for babies who are born before 34 weeks of pregnancy. How is preterm labor treated? Treatment depends on the length of your pregnancy, your condition, and the health of your baby. It may involve:  Having a stitch (suture) placed in your cervix to prevent your cervix from opening too early (cerclage).  Taking or being given medicines, such as: ? Hormone medicines. These may be given early in pregnancy to help support the pregnancy. ? Medicine to stop contractions. ? Medicines to help mature the baby's lungs. These may be prescribed if the risk of delivery is high. ? Medicines to prevent your baby from developing cerebral palsy.  If the labor happens before 34 weeks of pregnancy, you may need to stay in the hospital. What should I do if I think I am in preterm labor? If  you think that you are going into preterm labor, call your health care provider right away. How can I prevent preterm labor in future pregnancies? To increase your chance of having a full-term pregnancy:  Do not use any tobacco products, such as cigarettes, chewing tobacco, and e-cigarettes. If you need help quitting, ask your health care provider.  Do not use street drugs or medicines that have not been prescribed to you during your  pregnancy.  Talk with your health care provider before taking any herbal supplements, even if you have been taking them regularly.  Make sure you gain a healthy amount of weight during your pregnancy.  Watch for infection. If you think that you might have an infection, get it checked right away.  Make sure to tell your health care provider if you have gone into preterm labor before.  This information is not intended to replace advice given to you by your health care provider. Make sure you discuss any questions you have with your health care provider. Document Released: 10/18/2003 Document Revised: 01/08/2016 Document Reviewed: 12/19/2015 Elsevier Interactive Patient Education  2018 [employer].

## 2017-08-01 ENCOUNTER — Other Ambulatory Visit: Payer: Self-pay

## 2017-08-01 ENCOUNTER — Inpatient Hospital Stay (HOSPITAL_COMMUNITY)
Admission: AD | Admit: 2017-08-01 | Discharge: 2017-08-01 | Disposition: A | Discharge status: Home / Self Care | Payer: Medicaid Other | Source: Ambulatory Visit | Attending: Obstetrics and Gynecology | Admitting: Obstetrics and Gynecology

## 2017-08-01 ENCOUNTER — Encounter (HOSPITAL_COMMUNITY): Payer: Self-pay

## 2017-08-01 DIAGNOSIS — Z87891 Personal history of nicotine dependence: Secondary | ICD-10-CM | POA: Insufficient documentation

## 2017-08-01 DIAGNOSIS — O479 False labor, unspecified: Secondary | ICD-10-CM | POA: Diagnosis not present

## 2017-08-01 DIAGNOSIS — O4703 False labor before 37 completed weeks of gestation, third trimester: Secondary | ICD-10-CM | POA: Insufficient documentation

## 2017-08-01 DIAGNOSIS — Z3A34 34 weeks gestation of pregnancy: Secondary | ICD-10-CM | POA: Insufficient documentation

## 2017-08-01 LAB — URINALYSIS, ROUTINE W REFLEX MICROSCOPIC
Bilirubin Urine: NEGATIVE
Glucose, UA: NEGATIVE mg/dL
Hgb urine dipstick: NEGATIVE
Ketones, ur: NEGATIVE mg/dL
Nitrite: NEGATIVE
PROTEIN: 30 mg/dL — AB
Specific Gravity, Urine: 1.02 (ref 1.005–1.030)
pH: 6 (ref 5.0–8.0)

## 2017-08-01 NOTE — MAU Provider Note (Signed)
Chief Complaint:  Contractions   None     HPI: Latoya [name] is a 26 y.o. G3P1011 at 3673w5d who presents to MAU reporting contractions that started about 20 minutes prior to arrival. Patient states that contractions were occurring every 3 minutes. Since being here they have since spaced out. She reports no contractions prior to this. Had recent intercourse. Patient also endorsing little fluid intake today.  Denies leakage of fluid, vaginal discharge, or vaginal bleeding. Good fetal movement.   Pregnancy Course:  [facility]  Past Medical History: Past Medical History:  Diagnosis Date  . No pertinent past medical history   . Vaginal Pap smear, abnormal     Past obstetric history: OB History  Gravida Para Term Preterm AB Living  3 1 1  0 1 1  SAB TAB Ectopic Multiple Live Births  1 0 0 0 1    # Outcome Date GA Lbr Len/2nd Weight Sex Delivery Anes PTL Lv  3 Current           2 SAB 09/2016          1 Term 05/09/11 1551w5d 13:36 / 01:49 3.311 kg (7 lb 4.8 oz) M Vag-Vacuum EPI N LIV      Past Surgical History: Past Surgical History:  Procedure Laterality Date  . OTHER SURGICAL HISTORY     Lymph node removed from abdomen     Family History: Family History  Problem Relation Age of Onset  . Colon cancer Paternal Grandfather   . Diabetes Paternal Grandmother   . Cancer Maternal Grandmother        breast  . Cancer Maternal Grandfather   . Hypertension Mother   . Stroke Maternal Aunt   . Mental retardation Maternal Aunt   . Cancer Maternal Aunt        breast  . Mental retardation Maternal Uncle   . Hypertension Maternal Uncle   . Stroke Maternal Uncle   . Diabetes Maternal Uncle   . Cancer Maternal Uncle        throat    Social History: Social History   Tobacco Use  . Smoking status: Former Smoker    Years: 3.00    Types: Cigarettes  . Smokeless tobacco: Never Used  . Tobacco comment: smokes 4-5 cig daily  Substance  Use Topics  . Alcohol use: No  . Drug use: No    Allergies: No Known Allergies  Meds:  Medications Prior to Admission  Medication Sig Dispense Refill Last Dose  . Prenatal Vit-Fe Fumarate-FA (PRENATAL VITAMIN) 27-0.8 MG TABS Take 1 tablet daily by mouth. 30 tablet 11 Taking    I have reviewed patient's Past Medical Hx, Surgical Hx, Family Hx, Social Hx, medications and allergies.   ROS:  All systems reviewed and are negative for acute change except as noted in the HPI.   Physical Exam   Patient Vitals for the past 24 hrs:  BP Temp Temp src Pulse Resp SpO2 Height Weight  08/01/17 1703 133/77 97.6 F (36.4 C) Oral (!) 102 20 96 % 5\' 3"  (1.6 m) 130.6 kg (288 lb)   Constitutional: Well-developed, well-nourished female in no acute distress.  Cardiovascular: normal rate and rhythm, pulses intact Respiratory: normal rate and effort.  GI: Abd soft, non-tender, gravid appropriate for gestational age.  MS: Extremities nontender, no edema, normal ROM Neurologic: Alert and oriented x 4.  Pelvic: NEFG, no blood. No CMT Psych: normal mood and affect  Dilation: 1  Effacement (%): Thick Station: Ballotable Presentation: Vertex Exam by:: Dr Doroteo GlassmanPhelps    Labs: Results for orders placed or performed during the hospital encounter of 08/01/17 (from the past 24 hour(s))  Urinalysis, Routine w reflex microscopic     Status: Abnormal   Collection Time: 08/01/17  3:10 PM  Result Value Ref Range   Color, Urine YELLOW YELLOW   APPearance CLOUDY (A) CLEAR   Specific Gravity, Urine 1.020 1.005 - 1.030   pH 6.0 5.0 - 8.0   Glucose, UA NEGATIVE NEGATIVE mg/dL   Hgb urine dipstick NEGATIVE NEGATIVE   Bilirubin Urine NEGATIVE NEGATIVE   Ketones, ur NEGATIVE NEGATIVE mg/dL   Protein, ur 30 (A) NEGATIVE mg/dL   Nitrite NEGATIVE NEGATIVE   Leukocytes, UA LARGE (A) NEGATIVE   RBC / HPF 0-5 0 - 5 RBC/hpf   WBC, UA 6-30 0 - 5 WBC/hpf   Bacteria, UA MANY (A) NONE SEEN   Squamous Epithelial / LPF  6-30 (A) NONE SEEN   Mucus PRESENT     Imaging:  No results found.  MAU Course: Vitals and nursing notes reviewed No signs of PTL  Treatments given in MAU: None  I personally reviewed the patient's NST today, found to be REACTIVE. 140 bpm, mod var, +accels, no decels. CTX: irregular   MDM: Plan of care reviewed with patient, including labs and tests ordered and medical treatment.   Assessment: 1. False labor   2. Braxton Hick's contraction     Plan: Discharge home in stable condition.  Preterm labor precautions and fetal kick counts reviewed Handout given Follow-up with OB provider   Caryl AdaJazma Phelps, DO OB Fellow [facility] for [facility], [facility] 08/01/2017 5:29 PM

## 2017-08-01 NOTE — Discharge Instructions (Signed)
Braxton Hicks Contractions °Contractions of the uterus can occur throughout pregnancy, but they are not always a sign that you are in labor. You may have practice contractions called Braxton Hicks contractions. These false labor contractions are sometimes confused with true labor. °What are Braxton Hicks contractions? °Braxton Hicks contractions are tightening movements that occur in the muscles of the uterus before labor. Unlike true labor contractions, these contractions do not result in opening (dilation) and thinning of the cervix. Toward the end of pregnancy (32-34 weeks), Braxton Hicks contractions can happen more often and may become stronger. These contractions are sometimes difficult to tell apart from true labor because they can be very uncomfortable. You should not feel embarrassed if you go to the hospital with false labor. °Sometimes, the only way to tell if you are in true labor is for your health care provider to look for changes in the cervix. The health care provider will do a physical exam and may monitor your contractions. If you are not in true labor, the exam should show that your cervix is not dilating and your water has not broken. °If there are other health problems associated with your pregnancy, it is completely safe for you to be sent home with false labor. You may continue to have Braxton Hicks contractions until you go into true labor. °How to tell the difference between true labor and false labor °True labor °· Contractions last 30-70 seconds. °· Contractions become very regular. °· Discomfort is usually felt in the top of the uterus, and it spreads to the lower abdomen and low back. °· Contractions do not go away with walking. °· Contractions usually become more intense and increase in frequency. °· The cervix dilates and gets thinner. °False labor °· Contractions are usually shorter and not as strong as true labor contractions. °· Contractions are usually irregular. °· Contractions  are often felt in the front of the lower abdomen and in the groin. °· Contractions may go away when you walk around or change positions while lying down. °· Contractions get weaker and are shorter-lasting as time goes on. °· The cervix usually does not dilate or become thin. °Follow these instructions at home: °· Take over-the-counter and prescription medicines only as told by your health care provider. °· Keep up with your usual exercises and follow other instructions from your health care provider. °· Eat and drink lightly if you think you are going into labor. °· If Braxton Hicks contractions are making you uncomfortable: °? Change your position from lying down or resting to walking, or change from walking to resting. °? Sit and rest in a tub of warm water. °? Drink enough fluid to keep your urine pale yellow. Dehydration may cause these contractions. °? Do slow and deep breathing several times an hour. °· Keep all follow-up prenatal visits as told by your health care provider. This is important. °Contact a health care provider if: °· You have a fever. °· You have continuous pain in your abdomen. °Get help right away if: °· Your contractions become stronger, more regular, and closer together. °· You have fluid leaking or gushing from your vagina. °· You pass blood-tinged mucus (bloody show). °· You have bleeding from your vagina. °· You have low back pain that you never had before. °· You feel your baby’s head pushing down and causing pelvic pressure. °· Your baby is not moving inside you as much as it used to. °Summary °· Contractions that occur before labor are called Braxton   Hicks contractions, false labor, or practice contractions. °· Braxton Hicks contractions are usually shorter, weaker, farther apart, and less regular than true labor contractions. True labor contractions usually become progressively stronger and regular and they become more frequent. °· Manage discomfort from Braxton Hicks contractions by  changing position, resting in a warm bath, drinking plenty of water, or practicing deep breathing. °This information is not intended to replace advice given to you by your health care provider. Make sure you discuss any questions you have with your health care provider. °Document Released: 12/11/2016 Document Revised: 12/11/2016 Document Reviewed: 12/11/2016 °Elsevier Interactive Patient Education © 2018 [employer] ° °

## 2017-08-01 NOTE — MAU Note (Signed)
Pt presents with c/o ctxs tthat began 20 minutes ago.  Reports ctxs as 2-3 minutes apart.  Denies LOF or VB.  Reports +FM.

## 2017-08-11 NOTE — L&D Delivery Note (Signed)
Patient: Shellia CarwinWhitney L Nolt MRN: [id number]  GBS status: postive, IAP given, 2 doses  Patient is a 27 y.o. now [id number] s/p NSVD at 5167w2d, who was admitted for SOL. SROM 7h 3066m prior to delivery with clear fluid.   Delivery Note At 8:15 AM a viable female was delivered via Vaginal, Vacuum (Extractor) (Presentation: vertex; OA ).  APGAR: 2, 9; weight  8lb 15 oz Placenta status: intact but trailing membranes .  Cord:  3-vessel: .  Cord pH: sent, pending  Anesthesia:  Epidural Episiotomy: None Lacerations: None Suture Repair: none Est. Blood Loss (mL): 650  Patient completed and pushing, FHT having decelerations with pushes, but recovering well. With good maternal effort, infant crowning, but FHT down to the 60s, and no recovery after 6 minutes and fetal head not delivered so decision was made to vacuum assist. Patient was verbally consented.  The soft vacuum soft cup was positioned over the sagittal suture 2 cm anterior to posterior fontanelle.  Pressure was then increased to 500 mmHg, and the patient was instructed to push.  Pulling was administered along the pelvic curve.  1 pull was administered during two push, no popoffs.  The fetal head was delivered, pressure released and suction cup removed. Attention was turned to shoulders, and immediately recognized difficulty delivering shoulders with just downward pressure. McRoberts maneuver done, and shoulders rotated with Rubin II maneuver, which helped release shoulder and body delivered. Head to shoulder delivery time was 50-seconds. Umbilical cord wrapped around baby's upper torso. Cord was clamped and cut immediately and infant placed in warmer under the attention of neonatal team. Placenta delivered spontaneously with gentle cord traction. Fundus firm with massage and Pitocin, but lower uterine segment remained with poor tone and brisk bleeding noted. Lower uterine segment of cleared of membranes, then bimanual massage done. Additional 10 U of IM  Pitocin also given. Tone and bleeding improved. Perineum inspected and found to have no lacerations  Mom to postpartum.  Baby to Couplet care / Skin to Skin after initial care by neonatal team  Kandra NicolasJulie P Tamiah Dysart 09/09/2017, 9:14 AM

## 2017-08-14 ENCOUNTER — Encounter: Payer: Self-pay | Admitting: Obstetrics and Gynecology

## 2017-08-14 ENCOUNTER — Ambulatory Visit (INDEPENDENT_AMBULATORY_CARE_PROVIDER_SITE_OTHER): Payer: Medicaid Other | Admitting: Obstetrics and Gynecology

## 2017-08-14 VITALS — BP 110/70 | HR 95 | Wt 279.4 lb

## 2017-08-14 DIAGNOSIS — Z3A36 36 weeks gestation of pregnancy: Secondary | ICD-10-CM

## 2017-08-14 DIAGNOSIS — Z331 Pregnant state, incidental: Secondary | ICD-10-CM

## 2017-08-14 DIAGNOSIS — Z3483 Encounter for supervision of other normal pregnancy, third trimester: Secondary | ICD-10-CM

## 2017-08-14 DIAGNOSIS — Z1389 Encounter for screening for other disorder: Secondary | ICD-10-CM

## 2017-08-14 LAB — POCT URINALYSIS DIPSTICK
Blood, UA: NEGATIVE
GLUCOSE UA: NEGATIVE
KETONES UA: NEGATIVE
Nitrite, UA: NEGATIVE

## 2017-08-14 NOTE — Progress Notes (Signed)
[id number]  Estimated Date of Delivery: 09/07/17 LROB 8856w4d  Chief Complaint  Patient presents with  . Routine Prenatal Visit    CHL only  ____  Patient complaints:none. BC discussed: POP while breast feeding then Nuvaring. Patient reports   good fetal movement,                           denies any bleeding , rupture of membranes,or regular contractions.  Blood pressure 110/70, pulse 95, weight 279 lb 6.4 oz (126.7 kg), last menstrual period 12/01/2016.   Urine results:notable for 1+ wbc, neg protein refer to the ob flow sheet for FH and FHR, ,                          Physical Examination: General appearance - alert, well appearing, and in no distress and overweight                                      Abdomen - FH 40 ,                                                         -FHR 119                                                         soft, nontender, nondistended, no masses or organomegaly                                      Pelvic - efg normal GBS GC CHL collected                                            Questions were answered. Assessment: LROB G3P1011 @ 1956w4d ,                         Obesity with  Body mass index is 49.49 kg/m.   Plan:  Continued routine obstetrical care, weekly eval til delivery  F/u in 1 weeks for lrob

## 2017-08-17 LAB — GC/CHLAMYDIA PROBE AMP
CHLAMYDIA, DNA PROBE: NEGATIVE
NEISSERIA GONORRHOEAE BY PCR: NEGATIVE

## 2017-08-18 NOTE — Progress Notes (Signed)
Noted and aware.

## 2017-08-24 ENCOUNTER — Encounter: Payer: Medicaid Other | Admitting: Women's Health

## 2017-08-25 ENCOUNTER — Encounter: Payer: Self-pay | Admitting: Obstetrics & Gynecology

## 2017-08-25 ENCOUNTER — Ambulatory Visit (INDEPENDENT_AMBULATORY_CARE_PROVIDER_SITE_OTHER): Payer: Medicaid Other | Admitting: Obstetrics & Gynecology

## 2017-08-25 VITALS — BP 102/68 | HR 98 | Wt 279.0 lb

## 2017-08-25 DIAGNOSIS — Z331 Pregnant state, incidental: Secondary | ICD-10-CM

## 2017-08-25 DIAGNOSIS — Z3A38 38 weeks gestation of pregnancy: Secondary | ICD-10-CM

## 2017-08-25 DIAGNOSIS — Z3483 Encounter for supervision of other normal pregnancy, third trimester: Secondary | ICD-10-CM

## 2017-08-25 DIAGNOSIS — Z1389 Encounter for screening for other disorder: Secondary | ICD-10-CM

## 2017-08-25 LAB — POCT URINALYSIS DIPSTICK
Glucose, UA: NEGATIVE
KETONES UA: NEGATIVE
Leukocytes, UA: NEGATIVE
Nitrite, UA: NEGATIVE
Protein, UA: NEGATIVE
RBC UA: NEGATIVE

## 2017-08-25 NOTE — Progress Notes (Signed)
[id number] 1652w1d Estimated Date of Delivery: 09/07/17  Blood pressure 102/68, pulse 98, weight 279 lb (126.6 kg), last menstrual period 12/01/2016.   BP weight and urine results all reviewed and noted.  Please refer to the obstetrical flow sheet for the fundal height and fetal heart rate documentation:  Patient reports good fetal movement, denies any bleeding and no rupture of membranes symptoms or regular contractions. Patient is without complaints. All questions were answered.  Orders Placed This Encounter  Procedures  . POCT Urinalysis Dipstick    Plan:  Continued routine obstetrical care,   Return in about 1 week (around 09/01/2017) for LROB.

## 2017-09-02 ENCOUNTER — Ambulatory Visit (INDEPENDENT_AMBULATORY_CARE_PROVIDER_SITE_OTHER): Payer: Medicaid Other | Admitting: Women's Health

## 2017-09-02 ENCOUNTER — Other Ambulatory Visit: Payer: Self-pay

## 2017-09-02 ENCOUNTER — Encounter: Payer: Self-pay | Admitting: Women's Health

## 2017-09-02 VITALS — BP 120/64 | HR 103 | Wt 282.0 lb

## 2017-09-02 DIAGNOSIS — O368130 Decreased fetal movements, third trimester, not applicable or unspecified: Secondary | ICD-10-CM | POA: Diagnosis not present

## 2017-09-02 DIAGNOSIS — Z1389 Encounter for screening for other disorder: Secondary | ICD-10-CM

## 2017-09-02 DIAGNOSIS — Z3A39 39 weeks gestation of pregnancy: Secondary | ICD-10-CM | POA: Diagnosis not present

## 2017-09-02 DIAGNOSIS — O26843 Uterine size-date discrepancy, third trimester: Secondary | ICD-10-CM

## 2017-09-02 DIAGNOSIS — O480 Post-term pregnancy: Secondary | ICD-10-CM

## 2017-09-02 DIAGNOSIS — Z331 Pregnant state, incidental: Secondary | ICD-10-CM

## 2017-09-02 DIAGNOSIS — Z3483 Encounter for supervision of other normal pregnancy, third trimester: Secondary | ICD-10-CM

## 2017-09-02 LAB — POCT URINALYSIS DIPSTICK
Blood, UA: NEGATIVE
GLUCOSE UA: NEGATIVE
Ketones, UA: NEGATIVE
NITRITE UA: NEGATIVE
PROTEIN UA: NEGATIVE

## 2017-09-02 NOTE — Progress Notes (Signed)
   LOW-RISK PREGNANCY VISIT Patient name: Latoya [name] MRN [id number]  Date of birth: 10/16/1990 Chief Complaint:   Routine Prenatal Visit  History of Present Illness:   Latoya [name] is a 27 y.o. 733P1011 female at 7724w2d with an Estimated Date of Delivery: 09/07/17 being seen today for ongoing management of a low-risk pregnancy.  Today she reports decreased fm today. Contractions: Irregular. Vag. Bleeding: None.  Movement: (!) Decreased. denies leaking of fluid. Review of Systems:   Pertinent items are noted in HPI Denies abnormal vaginal discharge w/ itching/odor/irritation, headaches, visual changes, shortness of breath, chest pain, abdominal pain, severe nausea/vomiting, or problems with urination or bowel movements unless otherwise stated above. Pertinent History Reviewed:  Reviewed past medical,surgical, social, obstetrical and family history.  Reviewed problem list, medications and allergies. Physical Assessment:   Vitals:   09/02/17 1135  BP: 120/64  Pulse: (!) 103  Weight: 282 lb (127.9 kg)  Body mass index is 49.95 kg/m.        Physical Examination:   General appearance: Well appearing, and in no distress  Mental status: Alert, oriented to person, place, and time  Skin: Warm & dry  Cardiovascular: Normal heart rate noted  Respiratory: Normal respiratory effort, no distress  Abdomen: Soft, gravid, nontender  Pelvic: Cervical exam performed  Dilation: 3 Effacement (%): 80 Station: -2 declines membrane sweeping  Extremities: Edema: Trace  Fetal Status: Fetal Heart Rate (bpm): 150 Fundal Height: 42 cm Movement: (!) Decreased Presentation: Vertex  NST: FHR baseline 150 bpm, Variability: moderate, Accelerations:present, Decelerations:  Absent= Cat 1/Reactive Toco: occ, mild    Results for orders placed or performed in visit on 09/02/17 (from the past 24 hour(s))  POCT urinalysis dipstick   Collection Time: 09/02/17 11:37 AM  Result Value Ref Range   Color, UA       Clarity, UA     Glucose, UA neg    Bilirubin, UA     Ketones, UA neg    Spec Grav, UA  1.010 - 1.025   Blood, UA neg    pH, UA  5.0 - 8.0   Protein, UA neg    Urobilinogen, UA  0.2 or 1.0 E.U./dL   Nitrite, UA neg    Leukocytes, UA Moderate (2+) (A) Negative   Appearance     Odor      Assessment & Plan:  1) Low-risk pregnancy G3P1011 at 10624w2d with an Estimated Date of Delivery: 09/07/17   2) Decreased fm, reactive nst, discussed and gave printed info on fkc  3) Uterine size > dates, will get efw/afi u/s   Meds: No orders of the defined types were placed in this encounter.  Labs/procedures today: sve, nst  Plan:  Continue routine obstetrical care   Reviewed: Term labor symptoms and general obstetric precautions including but not limited to vaginal bleeding, contractions, leaking of fluid and fetal movement were reviewed in detail with the patient.  All questions were answered  Follow-up: Return in about 8 days (around 09/10/2017) for LROB, US:BPP, efw.  Orders Placed This Encounter  Procedures  . US OB Follow Up  . US FETAL BPP WO NON STRESS  . POCT urinalysis dipstick   Marge DuncansBooker, Marabelle Cushman Randall CNM, [facility] HospitalWHNP-BC 09/02/2017 12:27 PM

## 2017-09-02 NOTE — Patient Instructions (Addendum)
Latoya [name], I greatly value your feedback.  If you receive a survey following your visit with [place] today, we appreciate you taking the time to fill it out.  Thanks, Joellyn Haff, CNM, WHNP-BC   Call the office [phone]) or go to [facility] if:  You begin to have strong, frequent contractions  Your water breaks.  Sometimes it is a big gush of fluid, sometimes it is just a trickle that keeps getting your panties wet or running down your legs  You have vaginal bleeding.  It is normal to have a small amount of spotting if your cervix was checked.   You don't feel your baby moving like normal.  If you don't, get you something to eat and drink and lay down and focus on feeling your baby move.  You should feel at least 10 movements in 2 hours.  If you don't, you should call the office or go to [facility].     [facility] Contractions Contractions of the uterus can occur throughout pregnancy, but they are not always a sign that you are in labor. You may have practice contractions called Braxton Hicks contractions. These false labor contractions are sometimes confused with true labor. What are Deberah Pelton contractions? Braxton Hicks contractions are tightening movements that occur in the muscles of the uterus before labor. Unlike true labor contractions, these contractions do not result in opening (dilation) and thinning of the cervix. Toward the end of pregnancy (32-34 weeks), Braxton Hicks contractions can happen more often and may become stronger. These contractions are sometimes difficult to tell apart from true labor because they can be very uncomfortable. You should not feel embarrassed if you go to the hospital with false labor. Sometimes, the only way to tell if you are in true labor is for your health care provider to look for changes in the cervix. The health care provider will do a physical exam and may monitor your contractions. If you are not in true labor, the exam should  show that your cervix is not dilating and your water has not broken. If there are other health problems associated with your pregnancy, it is completely safe for you to be sent home with false labor. You may continue to have Braxton Hicks contractions until you go into true labor. How to tell the difference between true labor and false labor True labor  Contractions last 30-70 seconds.  Contractions become very regular.  Discomfort is usually felt in the top of the uterus, and it spreads to the lower abdomen and low back.  Contractions do not go away with walking.  Contractions usually become more intense and increase in frequency.  The cervix dilates and gets thinner. False labor  Contractions are usually shorter and not as strong as true labor contractions.  Contractions are usually irregular.  Contractions are often felt in the front of the lower abdomen and in the groin.  Contractions may go away when you walk around or change positions while lying down.  Contractions get weaker and are shorter-lasting as time goes on.  The cervix usually does not dilate or become thin. Follow these instructions at home:  Take over-the-counter and prescription medicines only as told by your health care provider.  Keep up with your usual exercises and follow other instructions from your health care provider.  Eat and drink lightly if you think you are going into labor.  If Braxton Hicks contractions are making you uncomfortable: ? Change your position from lying  down or resting to walking, or change from walking to resting. ? Sit and rest in a tub of warm water. ? Drink enough fluid to keep your urine pale yellow. Dehydration may cause these contractions. ? Do slow and deep breathing several times an hour.  Keep all follow-up prenatal visits as told by your health care provider. This is important. Contact a health care provider if:  You have a fever.  You have continuous pain in  your abdomen. Get help right away if:  Your contractions become stronger, more regular, and closer together.  You have fluid leaking or gushing from your vagina.  You pass blood-tinged mucus (bloody show).  You have bleeding from your vagina.  You have low back pain that you never had before.  You feel your baby's head pushing down and causing pelvic pressure.  Your baby is not moving inside you as much as it used to. Summary  Contractions that occur before labor are called Braxton Hicks contractions, false labor, or practice contractions.  Braxton Hicks contractions are usually shorter, weaker, farther apart, and less regular than true labor contractions. True labor contractions usually become progressively stronger and regular and they become more frequent.  Manage discomfort from [facility] LPBraxton Hicks contractions by changing position, resting in a warm bath, drinking plenty of water, or practicing deep breathing. This information is not intended to replace advice given to you by your health care provider. Make sure you discuss any questions you have with your health care provider. Document Released: 12/11/2016 Document Revised: 12/11/2016 Document Reviewed: 12/11/2016 Elsevier Interactive Patient Education  2018 [employer]   Fetal Movement Counts Patient Name: ________________________________________________ Patient Due Date: ____________________ What is a fetal movement count? A fetal movement count is the number of times that you feel your baby move during a certain amount of time. This may also be called a fetal kick count. A fetal movement count is recommended for every pregnant woman. You may be asked to start counting fetal movements as early as week 28 of your pregnancy. Pay attention to when your baby is most active. You may notice your baby's sleep and wake cycles. You may also notice things that make your baby move more. You should do a fetal movement count:  When your  baby is normally most active.  At the same time each day.  A good time to count movements is while you are resting, after having something to eat and drink. How do I count fetal movements? 1. Find a quiet, comfortable area. Sit, or lie down on your side. 2. Write down the date, the start time and stop time, and the number of movements that you felt between those two times. Take this information with you to your health care visits. 3. For 2 hours, count kicks, flutters, swishes, rolls, and jabs. You should feel at least 10 movements during 2 hours. 4. You may stop counting after you have felt 10 movements. 5. If you do not feel 10 movements in 2 hours, have something to eat and drink. Then, keep resting and counting for 1 hour. If you feel at least 4 movements during that hour, you may stop counting. Contact a health care provider if:  You feel fewer than 4 movements in 2 hours.  Your baby is not moving like he or she usually does. Date: ____________ Start time: ____________ Stop time: ____________ Movements: ____________ Date: ____________ Start time: ____________ Stop time: ____________ Movements: ____________ Date: ____________ Start time: ____________ Stop time: ____________  Movements: ____________ Date: ____________ Start time: ____________ Stop time: ____________ Movements: ____________ Date: ____________ Start time: ____________ Stop time: ____________ Movements: ____________ Date: ____________ Start time: ____________ Stop time: ____________ Movements: ____________ Date: ____________ Start time: ____________ Stop time: ____________ Movements: ____________ Date: ____________ Start time: ____________ Stop time: ____________ Movements: ____________ Date: ____________ Start time: ____________ Stop time: ____________ Movements: ____________ This information is not intended to replace advice given to you by your health care provider. Make sure you discuss any questions you have with your  health care provider. Document Released: 08/27/2006 Document Revised: 03/26/2016 Document Reviewed: 09/06/2015 Elsevier Interactive Patient Education  [employer].

## 2017-09-06 ENCOUNTER — Inpatient Hospital Stay (HOSPITAL_COMMUNITY)
Admission: AD | Admit: 2017-09-06 | Discharge: 2017-09-06 | Disposition: A | Discharge status: Home / Self Care | Payer: Medicaid Other | Source: Ambulatory Visit | Attending: Family Medicine | Admitting: Family Medicine

## 2017-09-06 ENCOUNTER — Encounter (HOSPITAL_COMMUNITY): Payer: Self-pay | Admitting: *Deleted

## 2017-09-06 DIAGNOSIS — O479 False labor, unspecified: Secondary | ICD-10-CM

## 2017-09-06 DIAGNOSIS — O471 False labor at or after 37 completed weeks of gestation: Secondary | ICD-10-CM | POA: Insufficient documentation

## 2017-09-06 DIAGNOSIS — Z3A40 40 weeks gestation of pregnancy: Secondary | ICD-10-CM | POA: Diagnosis not present

## 2017-09-06 NOTE — MAU Note (Signed)
Patient c/o  Nosebleed One time this afternoon  +contractions Pain rating 5/10 Intermittent  Denies vaginal bleeding. +mucus like discharge  VE 3cm on Wednesday patient endorses  Patient is inquiring about getting her membranes swept this visit.

## 2017-09-06 NOTE — Discharge Instructions (Signed)
Braxton Hicks Contractions °Contractions of the uterus can occur throughout pregnancy, but they are not always a sign that you are in labor. You may have practice contractions called Braxton Hicks contractions. These false labor contractions are sometimes confused with true labor. °What are Braxton Hicks contractions? °Braxton Hicks contractions are tightening movements that occur in the muscles of the uterus before labor. Unlike true labor contractions, these contractions do not result in opening (dilation) and thinning of the cervix. Toward the end of pregnancy (32-34 weeks), Braxton Hicks contractions can happen more often and may become stronger. These contractions are sometimes difficult to tell apart from true labor because they can be very uncomfortable. You should not feel embarrassed if you go to the hospital with false labor. °Sometimes, the only way to tell if you are in true labor is for your health care provider to look for changes in the cervix. The health care provider will do a physical exam and may monitor your contractions. If you are not in true labor, the exam should show that your cervix is not dilating and your water has not broken. °If there are other health problems associated with your pregnancy, it is completely safe for you to be sent home with false labor. You may continue to have Braxton Hicks contractions until you go into true labor. °How to tell the difference between true labor and false labor °True labor °· Contractions last 30-70 seconds. °· Contractions become very regular. °· Discomfort is usually felt in the top of the uterus, and it spreads to the lower abdomen and low back. °· Contractions do not go away with walking. °· Contractions usually become more intense and increase in frequency. °· The cervix dilates and gets thinner. °False labor °· Contractions are usually shorter and not as strong as true labor contractions. °· Contractions are usually irregular. °· Contractions  are often felt in the front of the lower abdomen and in the groin. °· Contractions may go away when you walk around or change positions while lying down. °· Contractions get weaker and are shorter-lasting as time goes on. °· The cervix usually does not dilate or become thin. °Follow these instructions at home: °· Take over-the-counter and prescription medicines only as told by your health care provider. °· Keep up with your usual exercises and follow other instructions from your health care provider. °· Eat and drink lightly if you think you are going into labor. °· If Braxton Hicks contractions are making you uncomfortable: °? Change your position from lying down or resting to walking, or change from walking to resting. °? Sit and rest in a tub of warm water. °? Drink enough fluid to keep your urine pale yellow. Dehydration may cause these contractions. °? Do slow and deep breathing several times an hour. °· Keep all follow-up prenatal visits as told by your health care provider. This is important. °Contact a health care provider if: °· You have a fever. °· You have continuous pain in your abdomen. °Get help right away if: °· Your contractions become stronger, more regular, and closer together. °· You have fluid leaking or gushing from your vagina. °· You pass blood-tinged mucus (bloody show). °· You have bleeding from your vagina. °· You have low back pain that you never had before. °· You feel your baby’s head pushing down and causing pelvic pressure. °· Your baby is not moving inside you as much as it used to. °Summary °· Contractions that occur before labor are called Braxton   Hicks contractions, false labor, or practice contractions. °· Braxton Hicks contractions are usually shorter, weaker, farther apart, and less regular than true labor contractions. True labor contractions usually become progressively stronger and regular and they become more frequent. °· Manage discomfort from Braxton Hicks contractions by  changing position, resting in a warm bath, drinking plenty of water, or practicing deep breathing. °This information is not intended to replace advice given to you by your health care provider. Make sure you discuss any questions you have with your health care provider. °Document Released: 12/11/2016 Document Revised: 12/11/2016 Document Reviewed: 12/11/2016 °Elsevier Interactive Patient Education © 2018 [employer] ° °

## 2017-09-09 ENCOUNTER — Inpatient Hospital Stay (HOSPITAL_COMMUNITY): Payer: Medicaid Other | Admitting: Anesthesiology

## 2017-09-09 ENCOUNTER — Inpatient Hospital Stay (HOSPITAL_COMMUNITY)
Admission: AD | Admit: 2017-09-09 | Discharge: 2017-09-10 | DRG: 806 | Disposition: A | Discharge status: Home / Self Care | Payer: Medicaid Other | Source: Ambulatory Visit | Attending: Family Medicine | Admitting: Family Medicine

## 2017-09-09 ENCOUNTER — Encounter (HOSPITAL_COMMUNITY): Payer: Self-pay

## 2017-09-09 DIAGNOSIS — O99214 Obesity complicating childbirth: Secondary | ICD-10-CM | POA: Diagnosis present

## 2017-09-09 DIAGNOSIS — O99824 Streptococcus B carrier state complicating childbirth: Secondary | ICD-10-CM | POA: Diagnosis present

## 2017-09-09 DIAGNOSIS — Z8759 Personal history of other complications of pregnancy, childbirth and the puerperium: Secondary | ICD-10-CM

## 2017-09-09 DIAGNOSIS — Z87891 Personal history of nicotine dependence: Secondary | ICD-10-CM

## 2017-09-09 DIAGNOSIS — R8271 Bacteriuria: Secondary | ICD-10-CM | POA: Diagnosis present

## 2017-09-09 DIAGNOSIS — O99324 Drug use complicating childbirth: Secondary | ICD-10-CM | POA: Diagnosis present

## 2017-09-09 DIAGNOSIS — F1290 Cannabis use, unspecified, uncomplicated: Secondary | ICD-10-CM | POA: Diagnosis present

## 2017-09-09 DIAGNOSIS — Z3A40 40 weeks gestation of pregnancy: Secondary | ICD-10-CM

## 2017-09-09 DIAGNOSIS — Z3490 Encounter for supervision of normal pregnancy, unspecified, unspecified trimester: Secondary | ICD-10-CM

## 2017-09-09 DIAGNOSIS — O480 Post-term pregnancy: Secondary | ICD-10-CM

## 2017-09-09 DIAGNOSIS — Z3483 Encounter for supervision of other normal pregnancy, third trimester: Secondary | ICD-10-CM | POA: Diagnosis present

## 2017-09-09 DIAGNOSIS — F17200 Nicotine dependence, unspecified, uncomplicated: Secondary | ICD-10-CM | POA: Diagnosis present

## 2017-09-09 LAB — CBC
HCT: 36.1 % (ref 36.0–46.0)
HEMATOCRIT: 40 % (ref 36.0–46.0)
HEMOGLOBIN: 12.2 g/dL (ref 12.0–15.0)
Hemoglobin: 13.7 g/dL (ref 12.0–15.0)
MCH: 30.2 pg (ref 26.0–34.0)
MCH: 29.8 pg (ref 26.0–34.0)
MCHC: 34.3 g/dL (ref 30.0–36.0)
MCHC: 33.8 g/dL (ref 30.0–36.0)
MCV: 88.3 fL (ref 78.0–100.0)
MCV: 88 fL (ref 78.0–100.0)
PLATELETS: 291 10*3/uL (ref 150–400)
Platelets: 284 10*3/uL (ref 150–400)
RBC: 4.53 MIL/uL (ref 3.87–5.11)
RBC: 4.1 MIL/uL (ref 3.87–5.11)
RDW: 13.5 % (ref 11.5–15.5)
RDW: 13.4 % (ref 11.5–15.5)
WBC: 13.3 10*3/uL — AB (ref 4.0–10.5)
WBC: 13.6 10*3/uL — ABNORMAL HIGH (ref 4.0–10.5)

## 2017-09-09 LAB — TYPE AND SCREEN
ABO/RH(D): A POS
Antibody Screen: NEGATIVE

## 2017-09-09 LAB — RAPID URINE DRUG SCREEN, HOSP PERFORMED
Amphetamines: NOT DETECTED
BARBITURATES: NOT DETECTED
BENZODIAZEPINES: NOT DETECTED
COCAINE: NOT DETECTED
Opiates: NOT DETECTED
TETRAHYDROCANNABINOL: NOT DETECTED

## 2017-09-09 LAB — RPR: RPR: NONREACTIVE

## 2017-09-09 LAB — ABO/RH: ABO/RH(D): A POS

## 2017-09-09 LAB — POCT FERN TEST: POCT Fern Test: NEGATIVE

## 2017-09-09 MED ORDER — LACTATED RINGERS IV SOLN
INTRAVENOUS | Status: DC
  Administered 2017-09-09 (×3): via INTRAVENOUS

## 2017-09-09 MED ORDER — DIPHENHYDRAMINE HCL 25 MG PO CAPS: 25.0000 mg | ORAL_CAPSULE | Freq: Four times a day (QID) | ORAL | Status: DC | PRN

## 2017-09-09 MED ORDER — ONDANSETRON HCL 4 MG/2ML IJ SOLN: 4.0000 mg | INTRAMUSCULAR | Status: DC | PRN

## 2017-09-09 MED ORDER — PHENYLEPHRINE 40 MCG/ML (10ML) SYRINGE FOR IV PUSH (FOR BLOOD PRESSURE SUPPORT)
80.0000 ug | PREFILLED_SYRINGE | INTRAVENOUS | Status: DC | PRN
  Filled 2017-09-09: qty 5
  Filled 2017-09-09: qty 10

## 2017-09-09 MED ORDER — PRENATAL MULTIVITAMIN CH
1.0000 | ORAL_TABLET | Freq: Every day | ORAL | Status: DC
  Administered 2017-09-09 – 2017-09-10 (×2): 1 via ORAL
  Filled 2017-09-09 (×2): qty 1

## 2017-09-09 MED ORDER — OXYCODONE-ACETAMINOPHEN 5-325 MG PO TABS: 1.0000 | ORAL_TABLET | ORAL | Status: DC | PRN

## 2017-09-09 MED ORDER — LACTATED RINGERS IV SOLN
INTRAVENOUS | Status: DC
  Administered 2017-09-09: 04:00:00 via INTRAUTERINE

## 2017-09-09 MED ORDER — PHENYLEPHRINE 40 MCG/ML (10ML) SYRINGE FOR IV PUSH (FOR BLOOD PRESSURE SUPPORT)
80.0000 ug | PREFILLED_SYRINGE | INTRAVENOUS | Status: DC | PRN
  Filled 2017-09-09: qty 5

## 2017-09-09 MED ORDER — EPHEDRINE 5 MG/ML INJ
10.0000 mg | INTRAVENOUS | Status: DC | PRN
  Filled 2017-09-09: qty 2

## 2017-09-09 MED ORDER — PENICILLIN G POTASSIUM 5000000 UNITS IJ SOLR
5.0000 10*6.[IU] | Freq: Once | INTRAVENOUS | Status: AC
  Administered 2017-09-09: 5 10*6.[IU] via INTRAVENOUS
  Filled 2017-09-09: qty 5

## 2017-09-09 MED ORDER — TETANUS-DIPHTH-ACELL PERTUSSIS 5-2.5-18.5 LF-MCG/0.5 IM SUSP: 0.5000 mL | Freq: Once | INTRAMUSCULAR | Status: DC

## 2017-09-09 MED ORDER — ONDANSETRON HCL 4 MG PO TABS: 4.0000 mg | ORAL_TABLET | ORAL | Status: DC | PRN

## 2017-09-09 MED ORDER — FENTANYL 2.5 MCG/ML BUPIVACAINE 1/10 % EPIDURAL INFUSION (WH - ANES)
14.0000 mL/h | INTRAMUSCULAR | Status: DC | PRN
  Administered 2017-09-09: 14 mL/h via EPIDURAL
  Filled 2017-09-09: qty 100

## 2017-09-09 MED ORDER — ZOLPIDEM TARTRATE 5 MG PO TABS: 5.0000 mg | ORAL_TABLET | Freq: Every evening | ORAL | Status: DC | PRN

## 2017-09-09 MED ORDER — SODIUM CHLORIDE 0.9 % IV SOLN
2.0000 g | Freq: Once | INTRAVENOUS | Status: AC
  Administered 2017-09-09: 2 g via INTRAVENOUS
  Filled 2017-09-09: qty 2000

## 2017-09-09 MED ORDER — IBUPROFEN 600 MG PO TABS
600.0000 mg | ORAL_TABLET | Freq: Four times a day (QID) | ORAL | Status: DC
  Administered 2017-09-09 – 2017-09-10 (×5): 600 mg via ORAL
  Filled 2017-09-09 (×5): qty 1

## 2017-09-09 MED ORDER — OXYCODONE-ACETAMINOPHEN 5-325 MG PO TABS: 2.0000 | ORAL_TABLET | ORAL | Status: DC | PRN

## 2017-09-09 MED ORDER — OXYTOCIN BOLUS FROM INFUSION
500.0000 mL | Freq: Once | INTRAVENOUS | Status: AC
  Administered 2017-09-09: 500 mL via INTRAVENOUS

## 2017-09-09 MED ORDER — PENICILLIN G POT IN DEXTROSE 60000 UNIT/ML IV SOLN
3.0000 10*6.[IU] | INTRAVENOUS | Status: DC
  Filled 2017-09-09 (×2): qty 50

## 2017-09-09 MED ORDER — LACTATED RINGERS IV SOLN: 500.0000 mL | Freq: Once | INTRAVENOUS | Status: DC

## 2017-09-09 MED ORDER — COCONUT OIL OIL: 1.0000 "application " | TOPICAL_OIL | Status: DC | PRN

## 2017-09-09 MED ORDER — DIBUCAINE 1 % RE OINT: 1.0000 "application " | TOPICAL_OINTMENT | RECTAL | Status: DC | PRN

## 2017-09-09 MED ORDER — OXYTOCIN 40 UNITS IN LACTATED RINGERS INFUSION - SIMPLE MED
2.5000 [IU]/h | INTRAVENOUS | Status: DC
  Filled 2017-09-09: qty 1000

## 2017-09-09 MED ORDER — ONDANSETRON HCL 4 MG/2ML IJ SOLN: 4.0000 mg | Freq: Four times a day (QID) | INTRAMUSCULAR | Status: DC | PRN

## 2017-09-09 MED ORDER — DIPHENHYDRAMINE HCL 50 MG/ML IJ SOLN: 12.5000 mg | INTRAMUSCULAR | Status: DC | PRN

## 2017-09-09 MED ORDER — ACETAMINOPHEN 325 MG PO TABS: 650.0000 mg | ORAL_TABLET | ORAL | Status: DC | PRN

## 2017-09-09 MED ORDER — SENNOSIDES-DOCUSATE SODIUM 8.6-50 MG PO TABS
2.0000 | ORAL_TABLET | ORAL | Status: DC
  Administered 2017-09-09: 2 via ORAL
  Filled 2017-09-09: qty 2

## 2017-09-09 MED ORDER — SIMETHICONE 80 MG PO CHEW: 80.0000 mg | CHEWABLE_TABLET | ORAL | Status: DC | PRN

## 2017-09-09 MED ORDER — SOD CITRATE-CITRIC ACID 500-334 MG/5ML PO SOLN: 30.0000 mL | ORAL | Status: DC | PRN

## 2017-09-09 MED ORDER — FENTANYL CITRATE (PF) 100 MCG/2ML IJ SOLN: 50.0000 ug | INTRAMUSCULAR | Status: DC | PRN

## 2017-09-09 MED ORDER — LIDOCAINE HCL (PF) 1 % IJ SOLN
30.0000 mL | INTRAMUSCULAR | Status: DC | PRN
  Filled 2017-09-09: qty 30

## 2017-09-09 MED ORDER — LACTATED RINGERS IV SOLN: 500.0000 mL | INTRAVENOUS | Status: DC | PRN

## 2017-09-09 MED ORDER — OXYTOCIN 10 UNIT/ML IJ SOLN
INTRAMUSCULAR | Status: AC
  Administered 2017-09-09: 10 [IU]
  Filled 2017-09-09: qty 1

## 2017-09-09 MED ORDER — OXYTOCIN 10 UNIT/ML IJ SOLN
10.0000 [IU] | Freq: Once | INTRAMUSCULAR | Status: DC | PRN
  Filled 2017-09-09: qty 1

## 2017-09-09 MED ORDER — WITCH HAZEL-GLYCERIN EX PADS: 1.0000 "application " | MEDICATED_PAD | CUTANEOUS | Status: DC | PRN

## 2017-09-09 MED ORDER — LIDOCAINE HCL (PF) 1 % IJ SOLN
INTRAMUSCULAR | Status: DC | PRN
  Administered 2017-09-09: 7 mL via EPIDURAL
  Administered 2017-09-09: 5 mL via EPIDURAL

## 2017-09-09 MED ORDER — BENZOCAINE-MENTHOL 20-0.5 % EX AERO
1.0000 "application " | INHALATION_SPRAY | CUTANEOUS | Status: DC | PRN
  Administered 2017-09-09: 1 via TOPICAL
  Filled 2017-09-09: qty 56

## 2017-09-09 NOTE — Progress Notes (Signed)
[  Late Entry]  FHT with recurrent decels, most variables.  IUPC and FSE placed around 0350 and amnioinfusion started.   Continue to monitor closely.   Raynelle FanningJulie P. Sigmond Patalano, MD OB Fellow

## 2017-09-09 NOTE — Anesthesia Preprocedure Evaluation (Signed)
Anesthesia Evaluation  Patient identified by MRN, date of birth, ID band Patient awake    Reviewed: Allergy & Precautions, H&P , NPO status , Patient's Chart, lab work & pertinent test results  Airway Mallampati: III  TM Distance: >3 FB Neck ROM: full    Dental no notable dental hx. (+) Teeth Intact   Pulmonary former smoker,    Pulmonary exam normal breath sounds clear to auscultation       Cardiovascular negative cardio ROS Normal cardiovascular exam Rhythm:regular Rate:Normal     Neuro/Psych negative neurological ROS  negative psych ROS   GI/Hepatic negative GI ROS, Neg liver ROS,   Endo/Other  Morbid obesity  Renal/GU negative Renal ROS  negative genitourinary   Musculoskeletal negative musculoskeletal ROS (+)   Abdominal (+) + obese,   Peds  Hematology negative hematology ROS (+)   Anesthesia Other Findings   Reproductive/Obstetrics (+) Pregnancy                             Anesthesia Physical  Anesthesia Plan  ASA: III  Anesthesia Plan: Epidural   Post-op Pain Management:    Induction:   PONV Risk Score and Plan:   Airway Management Planned:   Additional Equipment:   Intra-op Plan:   Post-operative Plan:   Informed Consent: I have reviewed the patients History and Physical, chart, labs and discussed the procedure including the risks, benefits and alternatives for the proposed anesthesia with the patient or authorized representative who has indicated his/her understanding and acceptance.     Plan Discussed with: Anesthesiologist  Anesthesia Plan Comments:         Anesthesia Quick Evaluation

## 2017-09-09 NOTE — Anesthesia Postprocedure Evaluation (Signed)
Anesthesia Post Note  Patient: Latoya CarwinWhitney L Ernster  Procedure(s) Performed: AN AD HOC LABOR EPIDURAL     Patient location during evaluation: Mother Baby Anesthesia Type: Epidural Level of consciousness: awake Pain management: pain level controlled Vital Signs Assessment: post-procedure vital signs reviewed and stable Respiratory status: spontaneous breathing Cardiovascular status: stable Postop Assessment: patient able to bend at knees and epidural receding Anesthetic complications: no    Last Vitals:  Vitals:   09/09/17 1015 09/09/17 1130  BP: 133/60 (!) 137/52  Pulse: 100 (!) 110  Resp: 20 20  Temp: 36.8 C 36.7 C  SpO2:      Last Pain:  Vitals:   09/09/17 1130  TempSrc: Oral  PainSc: 0-No pain   Pain Goal:                 Edison PaceWILKERSON,Sharay Bellissimo

## 2017-09-09 NOTE — H&P (Signed)
LABOR AND DELIVERY ADMISSION HISTORY AND PHYSICAL NOTE  Latoya [name] is a 27 y.o. female G60P1011 with IUP at [redacted]w[redacted]d by LMP + 8-wk U/S presenting for SOL. Had LOF around 22:15 ?ROM, clear fluid.  She reports positive fetal movement.   Prenatal History/Complications: PNC at [facility] Pregnancy complications:  - GBS bacteriuria - Marijuana use, - Tobacco use  Past Medical History: Past Medical History:  Diagnosis Date  . No pertinent past medical history   . Vaginal Pap smear, abnormal     Past Surgical History: Past Surgical History:  Procedure Laterality Date  . OTHER SURGICAL HISTORY     Lymph node removed from abdomen    Obstetrical History: OB History    Gravida Para Term Preterm AB Living   3 1 1  0 1 1   SAB TAB Ectopic Multiple Live Births   1 0 0 0 1      Social History: Social History   Socioeconomic History  . Marital status: Single    Spouse name: None  . Number of children: None  . Years of education: None  . Highest education level: None  Social Needs  . Financial resource strain: None  . Food insecurity - worry: None  . Food insecurity - inability: None  . Transportation needs - medical: None  . Transportation needs - non-medical: None  Occupational History  . None  Tobacco Use  . Smoking status: Former Smoker    Years: 3.00    Types: Cigarettes    Last attempt to quit: 12/13/2016    Years since quitting: 0.7  . Smokeless tobacco: Never Used  . Tobacco comment: smokes 4-5 cig daily  Substance and Sexual Activity  . Alcohol use: No  . Drug use: No  . Sexual activity: Yes    Birth control/protection: None  Other Topics Concern  . None  Social History Narrative  . None    Family History: Family History  Problem Relation Age of Onset  . Colon cancer Paternal Grandfather   . Diabetes Paternal Grandmother   . Cancer Maternal Grandmother        breast  . Cancer Maternal Grandfather   . Hypertension Mother   . Stroke Maternal  Aunt   . Mental retardation Maternal Aunt   . Cancer Maternal Aunt        breast  . Mental retardation Maternal Uncle   . Hypertension Maternal Uncle   . Stroke Maternal Uncle   . Diabetes Maternal Uncle   . Cancer Maternal Uncle        throat    Allergies: No Known Allergies  Medications Prior to Admission  Medication Sig Dispense Refill Last Dose  . Prenatal Vit-Fe Fumarate-FA (PRENATAL VITAMIN) 27-0.8 MG TABS Take 1 tablet daily by mouth. 30 tablet 11 09/08/2017 at Unknown time     Review of Systems  All systems reviewed and negative except as stated in HPI  Physical Exam Blood pressure 132/70, pulse 91, temperature 97.9 F (36.6 C), temperature source Oral, resp. rate 18, height 5\' 8"  (1.727 m), weight 287 lb (130.2 kg), last menstrual period 12/01/2016. General appearance: alert, oriented, NAD Lungs: normal respiratory effort Heart: regular rate Abdomen: soft, non-tender; gravid, FH appropriate for GA Extremities: No calf swelling or tenderness Presentation: cephalic Fetal monitoring: baseline rate 135, moderate varibility, +acel, variable decels Uterine activity: ctx q 1-3 min Dilation: 6 Effacement (%): 90 Station: -1 Exam by:: debra callaway rn   Prenatal labs: ABO, Rh: A/Positive/-- (07/06 1126)  Antibody: Negative (11/08 0909) Rubella: 5.37 (07/06 1126) RPR: Non Reactive (11/08 0909)  HBsAg: Negative (07/06 1126)  HIV: Non Reactive (11/08 0909)  GC/Chlamydia: negative (08/14/17) GBS:   positive in urine 2-hr GTT: normal Genetic screening:  declined Anatomy US: female, isolated L EICF; repeat U/S at 28 weeks, EICF   Prenatal Transfer Tool  Maternal Diabetes: No Genetic Screening: Declined Maternal Ultrasounds/Referrals: Abnormal:  Findings:   Isolated EIF (echogenic intracardiac focus), resolved Fetal Ultrasounds or other Referrals:  None Maternal Substance Abuse:  Yes:  Type: Marijuana Significant Maternal Medications:  None Significant Maternal Lab  Results: Lab values include: Group B Strep positive  Results for orders placed or performed during the hospital encounter of 09/09/17 (from the past 24 hour(s))  CBC   Collection Time: 09/09/17  1:19 AM  Result Value Ref Range   WBC 13.3 (H) 4.0 - 10.5 K/uL   RBC 4.53 3.87 - 5.11 MIL/uL   Hemoglobin 13.7 12.0 - 15.0 g/dL   HCT 09.840.0 11.936.0 - 14.746.0 %   MCV 88.3 78.0 - 100.0 fL   MCH 30.2 26.0 - 34.0 pg   MCHC 34.3 30.0 - 36.0 g/dL   RDW 82.913.5 56.211.5 - 13.015.5 %   Platelets 291 150 - 400 K/uL  POCT fern test   Collection Time: 09/09/17  1:34 AM  Result Value Ref Range   POCT Fern Test Negative = intact amniotic membranes     Assessment: Latoya [name] is a [age] y.o. G3P1011 at 7312w2d here for SOL  #Labor: expectant managemnet #Pain: Will get epidural #FWB: Cat II #ID:  GBS pos--ampcilln started #MOF: breast #MOC: POP #Circ:  outpatient  Kandra NicolasJulie P Parth Mccormac 09/09/2017, 2:06 AM

## 2017-09-09 NOTE — MAU Note (Signed)
Pt reports LOF around 10:15pm-clear fluid, has continued to leak some. Pt denies vaginal bleeding. Pt reports contractions every 2-3 mins. Reports good fetal movement. Cervix was 4cm on Sunday

## 2017-09-09 NOTE — Anesthesia Procedure Notes (Signed)
Epidural Patient location during procedure: OB Start time: 09/09/2017 2:15 AM End time: 09/09/2017 2:19 AM  Staffing Anesthesiologist: Leilani AbleHatchett, Rigby Leonhardt, MD Performed: anesthesiologist   Preanesthetic Checklist Completed: patient identified, site marked, surgical consent, pre-op evaluation, timeout performed, IV checked, risks and benefits discussed and monitors and equipment checked  Epidural Patient position: sitting Prep: site prepped and draped and DuraPrep Patient monitoring: continuous pulse ox and blood pressure Approach: midline Location: L3-L4 Injection technique: LOR air  Needle:  Needle type: Tuohy  Needle gauge: 17 G Needle length: 9 cm and 9 Needle insertion depth: 8 cm Catheter type: closed end flexible Catheter size: 19 Gauge Catheter at skin depth: 14 cm Test dose: negative and Other  Assessment Sensory level: T9 Events: blood not aspirated, injection not painful, no injection resistance, negative IV test and no paresthesia

## 2017-09-10 ENCOUNTER — Encounter: Payer: Medicaid Other | Admitting: Women's Health

## 2017-09-10 ENCOUNTER — Other Ambulatory Visit: Payer: Medicaid Other

## 2017-09-10 LAB — CBC
HCT: 36.3 % (ref 36.0–46.0)
HEMOGLOBIN: 12.2 g/dL (ref 12.0–15.0)
MCH: 29.9 pg (ref 26.0–34.0)
MCHC: 33.6 g/dL (ref 30.0–36.0)
MCV: 89 fL (ref 78.0–100.0)
Platelets: 258 10*3/uL (ref 150–400)
RBC: 4.08 MIL/uL (ref 3.87–5.11)
RDW: 13.7 % (ref 11.5–15.5)
WBC: 16.7 10*3/uL — AB (ref 4.0–10.5)

## 2017-09-10 LAB — BIRTH TISSUE RECOVERY COLLECTION (PLACENTA DONATION)

## 2017-09-10 MED ORDER — IBUPROFEN 600 MG PO TABS: 600.0000 mg | ORAL_TABLET | Freq: Four times a day (QID) | ORAL | 0 refills | Status: DC

## 2017-09-10 NOTE — Discharge Summary (Signed)
OB Discharge Summary     Patient Name: Latoya [name] DOB: [birth year] MRN: [id number]  Date of admission: 09/09/2017 Delivering MD: Frederik Pear   Date of discharge: 09/10/2017  Admitting diagnosis: 40.1 WEEKS ROM CTX Intrauterine pregnancy: [redacted]w[redacted]d     Secondary diagnosis:  Principal Problem:   Status post vacuum-assisted vaginal delivery Active Problems:   Supervision of normal pregnancy   Smoker   Marijuana use   GBS bacteriuria  Additional problems: Mild shoulder dystocia, 50 seconds     Discharge diagnosis: Term Pregnancy Delivered                                                                                                Post partum procedures:none  Augmentation: none  Complications: None  Hospital course:  Onset of Labor With Vaginal Delivery     27 y.o. yo [id number] at 106w2d was admitted in Active Labor on 09/09/2017. Patient had an uncomplicated labor course as follows:  Proceeded quickly but had FHR decelerations, so amnioinfusion started Required vacuum assistance Mild shoulder dystocia lasted 50 seconds Membrane Rupture Time/Date: 1:00 AM ,09/09/2017   Intrapartum Procedures: Episiotomy: None [1]                                         Lacerations:  None [1]  Patient had a delivery of a Viable infant. 09/09/2017  Information for the patient's newborn:  Senia, Even [[id number]]  Delivery Method: Vaginal, Vacuum (Extractor)(Filed from Delivery Summary)    Pateint had an uncomplicated postpartum course.  She is ambulating, tolerating a regular diet, passing flatus, and urinating well. Patient is discharged home in stable condition on 09/10/17.   Physical exam  Vitals:   09/09/17 1015 09/09/17 1130 09/09/17 1520 09/09/17 2320  BP: 133/60 (!) 137/52 (!) 113/47 (!) 108/59  Pulse: 100 (!) 110 91 92  Resp: 20 20 18 20   Temp: 98.3 F (36.8 C) 98.1 F (36.7 C) 98.5 F (36.9 C) 98.5 F (36.9 C)  TempSrc: Oral Oral Oral Oral  SpO2:      Weight:       Height:       General: alert, cooperative and no distress Lochia: appropriate Uterine Fundus: firm Incision: N/A DVT Evaluation: No evidence of DVT seen on physical exam. Labs: Lab Results  Component Value Date   WBC 13.6 (H) 09/09/2017   HGB 12.2 09/09/2017   HCT 36.1 09/09/2017   MCV 88.0 09/09/2017   PLT 284 09/09/2017   No flowsheet data found.  Discharge instruction: per After Visit Summary and "Baby and Me Booklet".  After visit meds:  Allergies as of 09/10/2017   No Known Allergies     Medication List    TAKE these medications   ibuprofen 600 MG tablet Commonly known as:  ADVIL,MOTRIN Take 1 tablet (600 mg total) by mouth every 6 (six) hours.   Prenatal Vitamin 27-0.8 MG Tabs Take 1 tablet daily by mouth.   sodium chloride 0.65 % Soln nasal spray  Commonly known as:  OCEAN Place 1 spray into both nostrils as needed for congestion.       Diet: routine diet  Activity: Advance as tolerated. Pelvic rest for 6 weeks.   Outpatient follow up:4 weeks Follow up Appt: Future Appointments  Date Time Provider Department Center  10/12/2017  9:30 AM Cheral MarkerBooker, Kimberly R, CNM [facility]   Follow up Visit:No Follow-up on file.  Postpartum contraception: Progesterone only pills  Newborn Data: Live born female  Birth Weight: 8 lb 15 oz (4054 g) APGAR: 2, 9  Newborn Delivery   Time head delivered:  09/09/2017 08:15:00 Birth date/time:  09/09/2017 08:15:00 Delivery type:  Vaginal, Vacuum (Extractor)     Baby Feeding: Bottle and Breast Disposition:home with mother   09/10/2017 Wynelle BourgeoisMarie Williams, CNM

## 2017-09-10 NOTE — Progress Notes (Signed)
CSW received consult for hx of marijuana use.  Referral was screened out due to the following: ~MOB had no documented substance use after initial prenatal visit/+UPT. ~MOB had no positive drug screens after initial prenatal visit/+UPT. ~Baby's UDS is negative.  CSW will monitor CDS results and make report to Child Protective Services if warranted.  CSW acknowledges being aware of multiple family stressors noted in Peds chart.  However, per bedside nurse MOB is not interested in speaking with CSW about stressors.  Please consult CSW if current concerns arise or by MOB's request.  Blaine HamperAngel Boyd-Gilyard, MSW, LCSW Clinical Social Work [phone]

## 2017-09-10 NOTE — Discharge Instructions (Signed)
Vaginal Delivery, Care After °Refer to this sheet in the next few weeks. These instructions provide you with information about caring for yourself after vaginal delivery. Your health care provider may also give you more specific instructions. Your treatment has been planned according to current medical practices, but problems sometimes occur. Call your health care provider if you have any problems or questions. °What can I expect after the procedure? °After vaginal delivery, it is common to have: °· Some bleeding from your vagina. °· Soreness in your abdomen, your vagina, and the area of skin between your vaginal opening and your anus (perineum). °· Pelvic cramps. °· Fatigue. ° °Follow these instructions at home: °Medicines °· Take over-the-counter and prescription medicines only as told by your health care provider. °· If you were prescribed an antibiotic medicine, take it as told by your health care provider. Do not stop taking the antibiotic until it is finished. °Driving ° °· Do not drive or operate heavy machinery while taking prescription pain medicine. °· Do not drive for 24 hours if you received a sedative. °Lifestyle °· Do not drink alcohol. This is especially important if you are breastfeeding or taking medicine to relieve pain. °· Do not use tobacco products, including cigarettes, chewing tobacco, or e-cigarettes. If you need help quitting, ask your health care provider. °Eating and drinking °· Drink at least 8 eight-ounce glasses of water every day unless you are told not to by your health care provider. If you choose to breastfeed your baby, you may need to drink more water than this. °· Eat high-fiber foods every day. These foods may help prevent or relieve constipation. High-fiber foods include: °? Whole grain cereals and breads. °? Brown rice. °? Beans. °? Fresh fruits and vegetables. °Activity °· Return to your normal activities as told by your health care provider. Ask your health care provider  what activities are safe for you. °· Rest as much as possible. Try to rest or take a nap when your baby is sleeping. °· Do not lift anything that is heavier than your baby or 10 lb (4.5 kg) until your health care provider says that it is safe. °· Talk with your health care provider about when you can engage in sexual activity. This may depend on your: °? Risk of infection. °? Rate of healing. °? Comfort and desire to engage in sexual activity. °Vaginal Care °· If you have an episiotomy or a vaginal tear, check the area every day for signs of infection. Check for: °? More redness, swelling, or pain. °? More fluid or blood. °? Warmth. °? Pus or a bad smell. °· Do not use tampons or douches until your health care provider says this is safe. °· Watch for any blood clots that may pass from your vagina. These may look like clumps of dark red, brown, or black discharge. °General instructions °· Keep your perineum clean and dry as told by your health care provider. °· Wear loose, comfortable clothing. °· Wipe from front to back when you use the toilet. °· Ask your health care provider if you can shower or take a bath. If you had an episiotomy or a perineal tear during labor and delivery, your health care provider may tell you not to take baths for a certain length of time. °· Wear a bra that supports your breasts and fits you well. °· If possible, have someone help you with household activities and help care for your baby for at least a few days after   you leave the hospital. °· Keep all follow-up visits for you and your baby as told by your health care provider. This is important. °Contact a health care provider if: °· You have: °? Vaginal discharge that has a bad smell. °? Difficulty urinating. °? Pain when urinating. °? A sudden increase or decrease in the frequency of your bowel movements. °? More redness, swelling, or pain around your episiotomy or vaginal tear. °? More fluid or blood coming from your episiotomy or  vaginal tear. °? Pus or a bad smell coming from your episiotomy or vaginal tear. °? A fever. °? A rash. °? Little or no interest in activities you used to enjoy. °? Questions about caring for yourself or your baby. °· Your episiotomy or vaginal tear feels warm to the touch. °· Your episiotomy or vaginal tear is separating or does not appear to be healing. °· Your breasts are painful, hard, or turn red. °· You feel unusually sad or worried. °· You feel nauseous or you vomit. °· You pass large blood clots from your vagina. If you pass a blood clot from your vagina, save it to show to your health care provider. Do not flush blood clots down the toilet without having your health care provider look at them. °· You urinate more than usual. °· You are dizzy or light-headed. °· You have not breastfed at all and you have not had a menstrual period for 12 weeks after delivery. °· You have stopped breastfeeding and you have not had a menstrual period for 12 weeks after you stopped breastfeeding. °Get help right away if: °· You have: °? Pain that does not go away or does not get better with medicine. °? Chest pain. °? Difficulty breathing. °? Blurred vision or spots in your vision. °? Thoughts about hurting yourself or your baby. °· You develop pain in your abdomen or in one of your legs. °· You develop a severe headache. °· You faint. °· You bleed from your vagina so much that you fill two sanitary pads in one hour. °This information is not intended to replace advice given to you by your health care provider. Make sure you discuss any questions you have with your health care provider. °Document Released: 07/25/2000 Document Revised: 01/09/2016 Document Reviewed: 08/12/2015 °Elsevier Interactive Patient Education © 2018 Elsevier Inc. ° °

## 2017-09-10 NOTE — Progress Notes (Signed)
Post Partum Day 1 Subjective: Patient is a 27 y.o. [id number] female s/p VAVD at 1826w2d gestation. EBL was 650mL.  no complaints, up ad lib, voiding, tolerating PO and + flatus  No BMs  Lochia is improving Pain controlled with ibuprofen   Objective: Blood pressure (!) 108/59, pulse 92, temperature 98.4 F (36.9 C), temperature source Oral, resp. rate 20, height 5\' 8"  (1.727 m), weight 127 kg (280 lb), last menstrual period 12/01/2016, SpO2 99 %, unknown if currently breastfeeding.  Physical Exam:  General: alert, cooperative and no distress Lochia: appropriate Uterine Fundus: firm Incision: NA DVT Evaluation: No evidence of DVT seen on physical exam.  Recent Labs    09/09/17 0910 09/10/17 0531  HGB 12.2 12.2  HCT 36.1 36.3   CBC    Component Value Date/Time   WBC 16.7 (H) 09/10/2017 0531   RBC 4.08 09/10/2017 0531   HGB 12.2 09/10/2017 0531   HGB 12.4 06/18/2017 0909   HCT 36.3 09/10/2017 0531   HCT 37.0 06/18/2017 0909   PLT 258 09/10/2017 0531   PLT 288 06/18/2017 0909   MCV 89.0 09/10/2017 0531   MCV 90 06/18/2017 0909   MCH 29.9 09/10/2017 0531   MCHC 33.6 09/10/2017 0531   RDW 13.7 09/10/2017 0531   RDW 13.4 06/18/2017 0909    Assessment/Plan: Patient is a 27 y.o. [id number] female s/p VAVD at 6426w2d gestation.  Plan for discharge tomorrow, breastfeeding and bottle feeding and contraception POPs  Would like nuvaring after stopping breastfeeding  Outpatient circumcision    LOS: 1 day   Ilsa Ihaicole Trombetta PA-S2 09/10/2017, 6:35 AM   I confirm that I have verified the information documented in the Student's note and that I have also personally reperformed the physical exam and all medical decision making activities.  Patient told me she wanted to go home Please see my note for official documentation and discharge summary Aviva SignsWilliams, Sujey Gundry L, [place]

## 2017-09-10 NOTE — Lactation Note (Signed)
This note was copied from a baby'[name] chart. Lactation Consultation Note  Patient Name: Latoya [name] ZOXWR'UToday'[name] Date: 09/10/2017 Reason for consult: Initial assessment;1st time breastfeeding;Term Breastfeeding consultation services and support information given and reviewed.  Mom is worried baby isn't getting enough.  Colostrum easily hand expressed.  Baby is currently latched in football hold.  Baby taken off breast due to shallow latch.  Mom shown how to latch baby to obtain more depth.  Baby latched with ease and fed actively with swallows.  Mom using good breast massage during feeding.  Teaching done and reassurance given.  Baby came off first side after 30 minutes and latched to opposite side well.  Discussed cluster feeding.  Encouraged to call for assist/concerns prn.  Maternal Data Has patient been taught Hand Expression?: Yes Does the patient have breastfeeding experience prior to this delivery?: No  Feeding Feeding Type: Breast Fed Length of feed: 30 min  LATCH Score Latch: Grasps breast easily, tongue down, lips flanged, rhythmical sucking.  Audible Swallowing: Spontaneous and intermittent  Type of Nipple: Everted at rest and after stimulation  Comfort (Breast/Nipple): Soft / non-tender  Hold (Positioning): Assistance needed to correctly position infant at breast and maintain latch.  LATCH Score: 9  Interventions Interventions: Breast feeding basics reviewed;Assisted with latch;Breast compression;Adjust position;Breast massage;Support pillows;Hand express  Lactation Tools Discussed/Used     Consult Status Consult Status: Follow-up Date: 09/11/17    Huston FoleyMOULDEN, Latoya [name] 09/10/2017, 10:32 AM

## 2017-10-12 ENCOUNTER — Ambulatory Visit: Payer: Medicaid Other | Admitting: Women's Health

## 2017-10-22 ENCOUNTER — Ambulatory Visit (INDEPENDENT_AMBULATORY_CARE_PROVIDER_SITE_OTHER): Payer: Medicaid Other | Admitting: Women's Health

## 2017-10-22 ENCOUNTER — Encounter: Payer: Self-pay | Admitting: Women's Health

## 2017-10-22 DIAGNOSIS — Z3202 Encounter for pregnancy test, result negative: Secondary | ICD-10-CM

## 2017-10-22 DIAGNOSIS — Z8759 Personal history of other complications of pregnancy, childbirth and the puerperium: Secondary | ICD-10-CM | POA: Diagnosis not present

## 2017-10-22 LAB — POCT URINE PREGNANCY: PREG TEST UR: NEGATIVE

## 2017-10-22 MED ORDER — ETONOGESTREL-ETHINYL ESTRADIOL 0.12-0.015 MG/24HR VA RING: VAGINAL_RING | VAGINAL | 3 refills | Status: DC

## 2017-10-22 NOTE — Patient Instructions (Signed)
Condoms x 2 weeks  Ethinyl Estradiol; Etonogestrel vaginal ring What is this medicine? ETHINYL ESTRADIOL; ETONOGESTREL (ETH in il es tra DYE ole; et oh noe JES trel) vaginal ring is a flexible, vaginal ring used as a contraceptive (birth control method). This medicine combines two types of female hormones, an estrogen and a progestin. This ring is used to prevent ovulation and pregnancy. Each ring is effective for one month. This medicine may be used for other purposes; ask your health care provider or pharmacist if you have questions. COMMON BRAND NAME(S): NuvaRing What should I tell my health care provider before I take this medicine? They need to know if you have or ever had any of these conditions: -abnormal vaginal bleeding -blood vessel disease or blood clots -breast, cervical, endometrial, ovarian, liver, or uterine cancer -diabetes -gallbladder disease -heart disease or recent heart attack -high blood pressure -high cholesterol -kidney disease -liver disease -migraine headaches -stroke -systemic lupus erythematosus (SLE) -tobacco smoker -an unusual or allergic reaction to estrogens, progestins, other medicines, foods, dyes, or preservatives -pregnant or trying to get pregnant -breast-feeding How should I use this medicine? Insert the ring into your vagina as directed. Follow the directions on the prescription label. The ring will remain place for 3 weeks and is then removed for a 1-week break. A new ring is inserted 1 week after the last ring was removed, on the same day of the week. Check often to make sure the ring is still in place, especially before and after sexual intercourse. If the ring was out of the vagina for an unknown amount of time, you may not be protected from pregnancy. Perform a pregnancy test and call your doctor. Do not use more often than directed. A patient package insert for the product will be given with each prescription and refill. Read this sheet  carefully each time. The sheet may change frequently. Contact your pediatrician regarding the use of this medicine in children. Special care may be needed. This medicine has been used in female children who have started having menstrual periods. Overdosage: If you think you have taken too much of this medicine contact a poison control center or emergency room at once. NOTE: This medicine is only for you. Do not share this medicine with others. What if I miss a dose? You will need to replace your vaginal ring once a month as directed. If the ring should slip out, or if you leave it in longer or shorter than you should, contact your health care professional for advice. What may interact with this medicine? Do not take this medicine with the following medication: -dasabuvir; ombitasvir; paritaprevir; ritonavir -ombitasvir; paritaprevir; ritonavir This medicine may also interact with the following medications: -acetaminophen -antibiotics or medicines for infections, especially rifampin, rifabutin, rifapentine, and griseofulvin, and possibly penicillins or tetracyclines -aprepitant -ascorbic acid (vitamin C) -atorvastatin -barbiturate medicines, such as phenobarbital -bosentan -carbamazepine -caffeine -clofibrate -cyclosporine -dantrolene -doxercalciferol -felbamate -grapefruit juice -hydrocortisone -medicines for anxiety or sleeping problems, such as diazepam or temazepam -medicines for diabetes, including pioglitazone -modafinil -mycophenolate -nefazodone -oxcarbazepine -phenytoin -prednisolone -ritonavir or other medicines for HIV infection or AIDS -rosuvastatin -selegiline -soy isoflavones supplements -St. John's wort -tamoxifen or raloxifene -theophylline -thyroid hormones -topiramate -warfarin This list may not describe all possible interactions. Give your health care provider a list of all the medicines, herbs, non-prescription drugs, or dietary supplements you use.  Also tell them if you smoke, drink alcohol, or use illegal drugs. Some items may interact with your medicine. What should   I watch for while using this medicine? Visit your doctor or health care professional for regular checks on your progress. You will need a regular breast and pelvic exam and Pap smear while on this medicine. Use an additional method of contraception during the first cycle that you use this ring. Do not use a diaphragm or female condom, as the ring can interfere with these birth control methods and their proper placement. If you have any reason to think you are pregnant, stop using this medicine right away and contact your doctor or health care professional. If you are using this medicine for hormone related problems, it may take several cycles of use to see improvement in your condition. Smoking increases the risk of getting a blood clot or having a stroke while you are using hormonal birth control, especially if you are more than 25 years old. You are strongly advised not to smoke. This medicine can make your body retain fluid, making your fingers, hands, or ankles swell. Your blood pressure can go up. Contact your doctor or health care professional if you feel you are retaining fluid. This medicine can make you more sensitive to the sun. Keep out of the sun. If you cannot avoid being in the sun, wear protective clothing and use sunscreen. Do not use sun lamps or tanning beds/booths. If you wear contact lenses and notice visual changes, or if the lenses begin to feel uncomfortable, consult your eye care specialist. In some women, tenderness, swelling, or minor bleeding of the gums may occur. Notify your dentist if this happens. Brushing and flossing your teeth regularly may help limit this. See your dentist regularly and inform your dentist of the medicines you are taking. If you are going to have elective surgery, you may need to stop using this medicine before the surgery. Consult  your health care professional for advice. This medicine does not protect you against HIV infection (AIDS) or any other sexually transmitted diseases. What side effects may I notice from receiving this medicine? Side effects that you should report to your doctor or health care professional as soon as possible: -breast tissue changes or discharge -changes in vaginal bleeding during your period or between your periods -chest pain -coughing up blood -dizziness or fainting spells -headaches or migraines -leg, arm or groin pain -severe or sudden headaches -stomach pain (severe) -sudden shortness of breath -sudden loss of coordination, especially on one side of the body -speech problems -symptoms of vaginal infection like itching, irritation or unusual discharge -tenderness in the upper abdomen -vomiting -weakness or numbness in the arms or legs, especially on one side of the body -yellowing of the eyes or skin Side effects that usually do not require medical attention (report to your doctor or health care professional if they continue or are bothersome): -breakthrough bleeding and spotting that continues beyond the 3 initial cycles of pills -breast tenderness -mood changes, anxiety, depression, frustration, anger, or emotional outbursts -increased sensitivity to sun or ultraviolet light -nausea -skin rash, acne, or brown spots on the skin -weight gain (slight) This list may not describe all possible side effects. Call your doctor for medical advice about side effects. You may report side effects to FDA at 1-800-FDA-1088. Where should I keep my medicine? Keep out of the reach of children. Store at room temperature between 15 and 30 degrees C (59 and 86 degrees F) for up to 4 months. The product will expire after 4 months. Protect from light. Throw away any unused medicine after   the expiration date. NOTE: This sheet is a summary. It may not cover all possible information. If you have  questions about this medicine, talk to your doctor, pharmacist, or health care provider.  2018 Elsevier/Gold Standard (2016-04-04 17:00:31)  

## 2017-10-22 NOTE — Progress Notes (Signed)
POSTPARTUM VISIT Patient name: Latoya [name] MRN [id number]  Date of birth: 25/04/1991 Chief Complaint:   Postpartum Care (wants bc)  History of Present Illness:   Latoya [name] is a 27 y.o. [id number] African American female being seen today for a postpartum visit. She is 6 weeks postpartum following a vacuum, low at 40.2 gestational weeks. VAVB d/t fetal bradycardia, then had 50sec shoulder dystocia resolved w/ McRobert's and Rubin maneuver, baby weighed 8lb15oz. Anesthesia: epidural. I have fully reviewed the prenatal and intrapartum course. Pregnancy uncomplicated. Postpartum course has been uncomplicated. Bleeding no bleeding. Bowel function is normal. Bladder function is normal.  Patient is sexually active. Last sexual activity: 3/2, then period started 3/4.  Contraception method is wants nuvaring. No h/o HTN, DVT/PE, CVA, MI, or migraines w/ aura. Does smoke 1/2ppd.  [place] Postpartum Depression Screening: negative. Score 5.   Last pap 06/18/17.  Results were normal .  Patient's last menstrual period was 10/12/2017.  Baby's course has been uncomplicated, equal movement bilateral arms. Baby is feeding by breast first now bottle.  Review of Systems:   Pertinent items are noted in HPI Denies Abnormal vaginal discharge w/ itching/odor/irritation, headaches, visual changes, shortness of breath, chest pain, abdominal pain, severe nausea/vomiting, or problems with urination or bowel movements. Pertinent History Reviewed:  Reviewed past medical,surgical, obstetrical and family history.  Reviewed problem list, medications and allergies. OB History  Gravida Para Term Preterm AB Living  3 2 2  0 1 2  SAB TAB Ectopic Multiple Live Births  1 0 0 0 2    # Outcome Date GA Lbr Len/2nd Weight Sex Delivery Anes PTL Lv  3 Term 09/09/17 252w2d 08:52 / 01:08 8 lb 15 oz (4.054 kg) M Vag-Vacuum EPI  LIV  2 SAB 09/2016          1 Term 05/09/11 251w5d 13:36 / 01:49 7 lb 4.8 oz (3.311 kg) M  Vag-Vacuum EPI N LIV     Physical Assessment:   Vitals:   10/22/17 0946  BP: 110/80  Pulse: 97  Weight: 248 lb 12.8 oz (112.9 kg)  Body mass index is 37.83 kg/m.       Physical Examination:   General appearance: alert, well appearing, and in no distress  Mental status: alert, oriented to person, place, and time  Skin: warm & dry   Cardiovascular: normal heart rate noted   Respiratory: normal respiratory effort, no distress   Breasts: deferred, no complaints   Abdomen: soft, non-tender   Pelvic: VULVA: normal appearing vulva with no masses, tenderness or lesions, UTERUS: uterus is normal size, shape, consistency and nontender- exam by Tonia GhentKatie Woods, NP student  Rectal: no hemorrhoids  Extremities: no edema       Results for orders placed or performed in visit on 10/22/17 (from the past 24 hour(s))  POCT urine pregnancy   Collection Time: 10/22/17  9:59 AM  Result Value Ref Range   Preg Test, Ur Negative Negative    Assessment & Plan:  1) Postpartum exam 2) 6 wks s/p VAVB with shoulder dystocia 3) Bottlefeeding 4) Depression screening 5) Contraception counseling, pt prefers NuvaRing vaginal inserts  Meds:  Meds ordered this encounter  Medications  . etonogestrel-ethinyl estradiol (NUVARING) 0.12-0.015 MG/24HR vaginal ring    Sig: Insert vaginally and leave in place for 3 consecutive weeks, then remove for 1 week.    Dispense:  3 each    Refill:  3    Order Specific Question:   Supervising Provider  Answer:   Lazaro Arms [[id number]]    Follow-up: Return in about 25 months (around 01/22/2018) for F/U.   Orders Placed This Encounter  Procedures  . POCT urine pregnancy    Cheral Marker CNM, [facility] 10/22/2017 10:33 AM

## 2018-01-22 ENCOUNTER — Ambulatory Visit: Payer: Medicaid Other | Admitting: Women's Health

## 2018-02-09 NOTE — Congregational Nurse Program (Signed)
Congregational Nurse Program Note  Date of Encounter: 02/09/2018  Past Medical History: Past Medical History:  Diagnosis Date  . No pertinent past medical history   . Vaginal Pap smear, abnormal     Encounter Details: CNP Questionnaire - 01/20/18 1010      Questionnaire   Patient Status  Not Applicable    Race  Black or African Marketing executiveAmerican    Location Patient Served At  [employer] [place]    Insurance  [employer]    Uninsured  Not Applicable    Food  No food insecurities    Housing/Utilities  Yes, have permanent housing    Transportation  No transportation needs    Interpersonal Safety  Yes, feel physically and emotionally safe where you currently live    Medication  No medication insecurities    Medical Provider  No    Referrals  [employer] officer, [employer]/[facility]    ED Visit Averted  Not Applicable    Life-Saving Intervention Made  Not Applicable     [facility]  [facility] on [place] 10 and 12 Jenene Slickermma Jenan Ellegood RN,[place] [facility] [phone]

## 2018-06-02 ENCOUNTER — Ambulatory Visit: Payer: Medicaid Other | Admitting: Adult Health

## 2018-06-11 ENCOUNTER — Encounter: Payer: Self-pay | Admitting: Adult Health

## 2018-06-11 ENCOUNTER — Encounter (INDEPENDENT_AMBULATORY_CARE_PROVIDER_SITE_OTHER): Payer: Self-pay

## 2018-06-11 ENCOUNTER — Ambulatory Visit (INDEPENDENT_AMBULATORY_CARE_PROVIDER_SITE_OTHER): Payer: Medicaid Other | Admitting: Adult Health

## 2018-06-11 VITALS — BP 100/55 | HR 67 | Ht 67.0 in | Wt 222.0 lb

## 2018-06-11 DIAGNOSIS — O3680X0 Pregnancy with inconclusive fetal viability, not applicable or unspecified: Secondary | ICD-10-CM

## 2018-06-11 DIAGNOSIS — N926 Irregular menstruation, unspecified: Secondary | ICD-10-CM

## 2018-06-11 DIAGNOSIS — Z3A10 10 weeks gestation of pregnancy: Secondary | ICD-10-CM

## 2018-06-11 DIAGNOSIS — Z3201 Encounter for pregnancy test, result positive: Secondary | ICD-10-CM | POA: Diagnosis not present

## 2018-06-11 LAB — POCT URINE PREGNANCY: PREG TEST UR: POSITIVE — AB

## 2018-06-11 MED ORDER — PRENATAL VITAMIN 27-0.8 MG PO TABS: 1.0000 | ORAL_TABLET | Freq: Every day | ORAL | 11 refills | Status: DC

## 2018-06-11 NOTE — Progress Notes (Signed)
  Subjective:     Patient ID: Latoya [name], female   DOB: 1990/10/26, 27 y.o.   MRN: [id number]  HPI [name] is a 27 year old black female in for UPT, has missed a period and had 1= HPT, she does not want to continue the pregnancy.She feels like she got pregnant when using the nuva ring.  Review of Systems +missed period, 1+HPT Reviewed past medical,surgical, social and family history. Reviewed medications and allergies.     Objective:   Physical Exam BP (!) 100/55 (BP Location: Left Arm, Patient Position: Sitting, Cuff Size: Large)   Pulse 67   Ht 5\' 7"  (1.702 m)   Wt 222 lb (100.7 kg)   LMP 03/30/2018   BMI 34.77 kg/m UPT +, about 10+2 weeks by LMP with EDD 01/04/18. Skin warm and dry. Neck: mid line trachea, normal thyroid, good ROM, no lymphadenopathy noted. Lungs: clear to ausculation bilaterally. Cardiovascular: regular rate and rhythm. Abdomen is soft and non tender. She declines handouts on pregnancy.     Assessment:     1. Pregnancy test positive   2. [redacted] weeks gestation of pregnancy   3. Encounter to determine fetal viability of pregnancy, single or unspecified fetus       Plan:     Meds ordered this encounter  Medications  . Prenatal Vit-Fe Fumarate-FA (PRENATAL VITAMIN) 27-0.8 MG TABS    Sig: Take 1 tablet by mouth daily.    Dispense:  30 tablet    Refill:  11    Order Specific Question:   Supervising Provider    Answer:   Lazaro Arms [[id number]]  Return in 1 week for dating [place], she already knows of clinics and is trying to get funds

## 2018-06-18 ENCOUNTER — Ambulatory Visit: Payer: Medicaid Other

## 2018-06-18 DIAGNOSIS — O3680X0 Pregnancy with inconclusive fetal viability, not applicable or unspecified: Secondary | ICD-10-CM | POA: Diagnosis not present

## 2018-06-18 NOTE — Progress Notes (Signed)
[place] 9+6 wks,single IUP w/ ys,positive fht 162 bpm,normal right ovary,simple left ovarian cyst 4.4 x 4 x 4.1 cm

## 2018-07-21 ENCOUNTER — Ambulatory Visit: Payer: Medicaid Other | Admitting: Adult Health

## 2018-07-28 ENCOUNTER — Ambulatory Visit: Payer: Medicaid Other | Admitting: Adult Health

## 2018-07-28 ENCOUNTER — Ambulatory Visit: Payer: Self-pay | Admitting: Advanced Practice Midwife

## 2018-07-28 ENCOUNTER — Other Ambulatory Visit: Payer: Self-pay

## 2018-07-28 ENCOUNTER — Ambulatory Visit (INDEPENDENT_AMBULATORY_CARE_PROVIDER_SITE_OTHER): Payer: Medicaid Other | Admitting: Advanced Practice Midwife

## 2018-07-28 ENCOUNTER — Encounter: Payer: Self-pay | Admitting: Advanced Practice Midwife

## 2018-07-28 VITALS — BP 112/68 | HR 95 | Ht 68.0 in | Wt 217.0 lb

## 2018-07-28 DIAGNOSIS — R52 Pain, unspecified: Secondary | ICD-10-CM | POA: Diagnosis not present

## 2018-07-28 DIAGNOSIS — O479 False labor, unspecified: Secondary | ICD-10-CM | POA: Diagnosis not present

## 2018-07-28 DIAGNOSIS — R58 Hemorrhage, not elsewhere classified: Secondary | ICD-10-CM | POA: Diagnosis not present

## 2018-07-28 DIAGNOSIS — O3481 Maternal care for other abnormalities of pelvic organs, first trimester: Secondary | ICD-10-CM | POA: Diagnosis not present

## 2018-07-28 DIAGNOSIS — O021 Missed abortion: Secondary | ICD-10-CM

## 2018-07-28 DIAGNOSIS — I951 Orthostatic hypotension: Secondary | ICD-10-CM | POA: Diagnosis not present

## 2018-07-28 DIAGNOSIS — R55 Syncope and collapse: Secondary | ICD-10-CM | POA: Diagnosis not present

## 2018-07-28 DIAGNOSIS — R0689 Other abnormalities of breathing: Secondary | ICD-10-CM | POA: Diagnosis not present

## 2018-07-28 DIAGNOSIS — Z3A01 Less than 8 weeks gestation of pregnancy: Secondary | ICD-10-CM | POA: Diagnosis not present

## 2018-07-28 MED ORDER — IBUPROFEN 600 MG PO TABS: 600.0000 mg | ORAL_TABLET | Freq: Four times a day (QID) | ORAL | 0 refills | Status: DC | PRN

## 2018-07-28 MED ORDER — HYDROCODONE-ACETAMINOPHEN 5-325 MG PO TABS: 1.0000 | ORAL_TABLET | Freq: Four times a day (QID) | ORAL | 0 refills | Status: DC | PRN

## 2018-07-28 MED ORDER — MISOPROSTOL 200 MCG PO TABS: 800.0000 ug | ORAL_TABLET | Freq: Once | ORAL | 0 refills | Status: DC

## 2018-07-28 NOTE — Progress Notes (Addendum)
[facility] Visit  Patient name: Latoya [name] MRN [id number]  Date of birth: 1990-11-19  CC & HPI:  Latoya [name] is a 27 y.o.  female presenting today for TAB F/U. Had TAB 11/16 (used pill) Bled about 1.5-2 weeks, not really heavy. THinks she passed the fetus this am (felts something "big" come out and "felt arms and legs") Bleeding increased now. Plans COCs for BC, maybe. IUD later  Pertinent History Reviewed:  Medical & Surgical Hx:   Past Medical History:  Diagnosis Date  . No pertinent past medical history   . Vaginal Pap smear, abnormal    Past Surgical History:  Procedure Laterality Date  . OTHER SURGICAL HISTORY     Lymph node removed from abdomen   Family History  Problem Relation Age of Onset  . Colon cancer Paternal Grandfather   . Diabetes Paternal Grandmother   . Cancer Maternal Grandmother        breast  . Cancer Maternal Grandfather   . Hypertension Mother   . Stroke Maternal Aunt   . Mental retardation Maternal Aunt   . Cancer Maternal Aunt        breast  . Mental retardation Maternal Uncle   . Hypertension Maternal Uncle   . Stroke Maternal Uncle   . Diabetes Maternal Uncle   . Cancer Maternal Uncle        throat    Current Outpatient Medications:  .  HYDROcodone-acetaminophen (NORCO/VICODIN) 5-325 MG tablet, Take 1-2 tablets by mouth every 6 (six) hours as needed for moderate pain., Disp: 20 tablet, Rfl: 0 .  ibuprofen (ADVIL,MOTRIN) 600 MG tablet, Take 1 tablet (600 mg total) by mouth every 6 (six) hours as needed., Disp: 30 tablet, Rfl: 0 .  misoprostol (CYTOTEC) 200 MCG tablet, Take 4 tablets (800 mcg total) by mouth once for 1 dose., Disp: 4 tablet, Rfl: 0 .  Prenatal Vit-Fe Fumarate-FA (PRENATAL VITAMIN) 27-0.8 MG TABS, Take 1 tablet by mouth daily. (Patient not taking: Reported on 07/28/2018), Disp: 30 tablet, Rfl: 11 Social History: Reviewed -  reports that she quit smoking about 19 months ago. Her smoking use included  cigarettes. She quit after 3.00 years of use. She has never used smokeless tobacco.  Review of Systems:   Constitutional: Negative for fever, FELT chills Eyes: Negative for visual disturbances Respiratory: Negative for shortness of breath, dyspnea Cardiovascular: Negative for chest pain or palpitations  Gastrointestinal: Negative for vomiting, diarrhea and constipation; no abdominal pain Genitourinary: Negative for dysuria and urgency, vaginal irritation or itching Musculoskeletal: Negative for back pain, joint pain, myalgias  Neurological: Negative for dizziness and headaches    Objective Findings:    Physical Examination: Vitals:   07/28/18 1204  BP: 112/68  Pulse: 95   General appearance - well appearing, and in no distress Mental status - alert, oriented to person, place, and time Chest:  Normal respiratory effort Heart - normal rate and regular rhythm Abdomen:  Soft, nontender Pelvic: umbilical cord protruding from cx,  Co exam w/JVF . Unable to remove tissue from uterus.  Bleeding minimal now.  Musculoskeletal:  Normal range of motion without pain Extremities:  No edema    No results found for this or any previous visit (from the past 24 hour(s)).    Assessment & Plan:  A:   Incomplete TAB P:  Pt unalbe to get D&C today d/t child care.  Rx Cytotec 869mcg/pain meds>  Bleeding precautions and Dr. Lum Babe phone number given to pt  in case problems arise tonight.   Orders Placed This Encounter  Procedures  . CBC  . Beta hCG quant (ref lab)    Otherwise.  Return for JVF at 0830 per JVF.  Jacklyn ShellFrances Cresenzo-Dishmon CNM 07/28/2018 1:39 PM

## 2018-07-28 NOTE — Patient Instructions (Signed)
NOTHING TO EAT OR DRINK AFTER 4 AM TONIGHT.    FACTS YOU SHOULD KNOW  WHAT IS AN EARLY PREGNANCY FAILURE? Once the egg is fertilized with the sperm and begins to develop, it attaches to the lining of the uterus. This early pregnancy tissue may not develop into an embryo (the beginning stage of a baby). Sometimes an embryo does develop but does not continue to grow. These problems can be seen on ultrasound.   MANAGEMNT OF EARLY PREGNANCY FAILURE: About 4 out of 100 (0.25%) women will have a pregnancy loss in her lifetime.  One in five pregnancies is found to be an early pregnancy failure.  There are 3 ways to care for an early pregnancy failure:    (1) Medicine, (2) Waiting for you to pass the pregnancy on your own. Sometimes, surgery is necessary.   The decision as to how to proceed after being diagnosed with and early pregnancy failure is an individual one.  The decision can be made only after appropriate counseling.  You need to weigh the pros and cons of the choices. Then you can make the choice that works for you. Medicine (CYTOTEC) . The complete procedure may take days to weeks, usually a few days . No Surgery . Bleeding may be heavy at times . There may be drug side effects . Patient has more control Waiting . You may choose to wait, in which case your own body may complete the passing of the abnormal early pregnancy on its own in about 2-4 weeks . Your bleeding may be heavy at times . There is a small possibility that you may need surgery if the bleeding is too much or not all of the pregnancy has passed.  SURGERY (D&C) . Procedure over in 1 day . Requires being put to sleep . Bleeding may be light . Possible problems during surgery, including injury to womb(uterus) . Care provider has more control  CYTOTEC MANAGEMENT Prostaglandins (cytotec) are the most widely used drug for this purpose. They cause the uterus to cramp and contract. You will place the medicine yourself inside  your vagina in the privacy of your home. Empting of the uterus should occur within 3 days but the process may continue for several weeks. The bleeding may seem heavy at times. POSSIBLE SIDE EFFECTS FROM CYTOTEC . Nausea   Vomiting . Diarrhea Fever . Chills  Hot Flashes Side effects  from the process of the early pregnancy failure include: . Cramping  Bleeding . Headaches  Dizziness RISKS: This is a low risk procedure. Less than 1 in 100 women has a complication. An incomplete passage of the early pregnancy may occur. Also, Hemorrhage (heavy bleeding) could happen.  Rarely the pregnancy will not be passed completely. Excessively heavy bleeding may occur.  Your doctor may need to perform surgery to empty the uterus (D&E). Afterwards: Everybody will feel differently after the early pregnancy completion. You may have soreness or cramps for a day or two. You may have soreness or cramps for day or two.  You may have light bleeding for up to 2 weeks. You may be as active as you feel like being. If you have any of the following problems you may call Maternity Admissions Unit at [phone]. . If you have pain that does not get better  with pain medication . Bleeding that soaks through 2 thick full-sized sanitary pads in an hour . Cramps that last longer than 2 days . Foul smelling discharge . Fever above 100.4  degrees F Even if you do not have any of these symptoms, you should have a follow-up exam to make sure you are healing properly. This appointment will be made for you before you leave the hospital. Your next normal period will start again in 4-6 week after the loss. You can get pregnant soon after the loss, so use birth control right away. Finally: Make sure all your questions are answered before during and after any procedure. Follow up with medical care and family planning methods.

## 2018-07-29 ENCOUNTER — Ambulatory Visit: Payer: Medicaid Other | Admitting: Obstetrics and Gynecology

## 2018-07-29 DIAGNOSIS — O031 Delayed or excessive hemorrhage following incomplete spontaneous abortion: Secondary | ICD-10-CM | POA: Diagnosis not present

## 2018-07-29 DIAGNOSIS — O034 Incomplete spontaneous abortion without complication: Secondary | ICD-10-CM | POA: Diagnosis not present

## 2018-07-29 LAB — CBC
HEMATOCRIT: 34.6 % (ref 34.0–46.6)
HEMOGLOBIN: 12 g/dL (ref 11.1–15.9)
MCH: 30.5 pg (ref 26.6–33.0)
MCHC: 34.7 g/dL (ref 31.5–35.7)
MCV: 88 fL (ref 79–97)
Platelets: 253 10*3/uL (ref 150–450)
RBC: 3.93 x10E6/uL (ref 3.77–5.28)
RDW: 12.3 % (ref 12.3–15.4)
WBC: 21.3 10*3/uL — AB (ref 3.4–10.8)

## 2018-07-29 LAB — BETA HCG QUANT (REF LAB): hCG Quant: 17416 m[IU]/mL

## 2018-08-10 ENCOUNTER — Ambulatory Visit: Payer: Medicaid Other | Admitting: Obstetrics and Gynecology

## 2019-10-26 DIAGNOSIS — Z3A00 Weeks of gestation of pregnancy not specified: Secondary | ICD-10-CM | POA: Diagnosis not present

## 2019-10-26 DIAGNOSIS — N939 Abnormal uterine and vaginal bleeding, unspecified: Secondary | ICD-10-CM | POA: Diagnosis not present

## 2019-10-26 DIAGNOSIS — Z3689 Encounter for other specified antenatal screening: Secondary | ICD-10-CM | POA: Diagnosis not present

## 2019-10-31 ENCOUNTER — Telehealth: Payer: Self-pay | Admitting: *Deleted

## 2019-10-31 NOTE — Telephone Encounter (Signed)
Patient states she wants to have STD screening along with a tubal.    Returned patient's call.  States she is having bumps in her vaginal area and would like to have STD screening . She also would like to get set up for a tubal.  Informed she would need to see a provider but is also in need of a physical.  Scheduled with [facility] for p/p and tubal discussion.

## 2019-11-01 ENCOUNTER — Other Ambulatory Visit (HOSPITAL_COMMUNITY)
Admission: RE | Admit: 2019-11-01 | Discharge: 2019-11-01 | Disposition: A | Discharge status: Home / Self Care | Payer: Medicaid Other | Source: Ambulatory Visit | Attending: Obstetrics & Gynecology | Admitting: Obstetrics & Gynecology

## 2019-11-01 ENCOUNTER — Ambulatory Visit (INDEPENDENT_AMBULATORY_CARE_PROVIDER_SITE_OTHER): Payer: Medicaid Other | Admitting: Obstetrics & Gynecology

## 2019-11-01 ENCOUNTER — Other Ambulatory Visit: Payer: Self-pay

## 2019-11-01 ENCOUNTER — Encounter: Payer: Self-pay | Admitting: Obstetrics & Gynecology

## 2019-11-01 VITALS — Ht 66.0 in | Wt 224.0 lb

## 2019-11-01 DIAGNOSIS — Z720 Tobacco use: Secondary | ICD-10-CM

## 2019-11-01 DIAGNOSIS — Z7251 High risk heterosexual behavior: Secondary | ICD-10-CM

## 2019-11-01 DIAGNOSIS — Z793 Long term (current) use of hormonal contraceptives: Secondary | ICD-10-CM

## 2019-11-01 DIAGNOSIS — Z01419 Encounter for gynecological examination (general) (routine) without abnormal findings: Secondary | ICD-10-CM

## 2019-11-01 DIAGNOSIS — Z1151 Encounter for screening for human papillomavirus (HPV): Secondary | ICD-10-CM

## 2019-11-01 DIAGNOSIS — Z0000 Encounter for general adult medical examination without abnormal findings: Secondary | ICD-10-CM

## 2019-11-01 DIAGNOSIS — A630 Anogenital (venereal) warts: Secondary | ICD-10-CM

## 2019-11-01 MED ORDER — TRICHLOROACETIC ACID 80 % EX LIQD: 1.0000 | Freq: Once | CUTANEOUS | 0 refills | Status: AC

## 2019-11-01 NOTE — Progress Notes (Signed)
Subjective:     Latoya [name] is a 29 y.o. female here for a routine exam.  Patient's last menstrual period was 10/01/2019. [id number] Birth Control Method:  OCP Menstrual Calendar(currently):   Current complaints: warts.   Current acute medical issues:     Recent Gynecologic History Patient's last menstrual period was 10/01/2019. Last Pap: 2018,  normal Last mammogram: ,    Past Medical History:  Diagnosis Date  . No pertinent past medical history   . Vaginal Pap smear, abnormal     Past Surgical History:  Procedure Laterality Date  . DILATION AND CURETTAGE, DIAGNOSTIC / THERAPEUTIC    . OTHER SURGICAL HISTORY     Lymph node removed from abdomen    OB History    Gravida  5   Para  2   Term  2   Preterm  0   AB  3   Living  2     SAB  1   TAB  2   Ectopic  0   Multiple  0   Live Births  2           Social History   Socioeconomic History  . Marital status: Single    Spouse name: Not on file  . Number of children: 2  . Years of education: Not on file  . Highest education level: Not on file  Occupational History  . Not on file  Tobacco Use  . Smoking status: Current Every Day Smoker    Types: Cigarettes  . Smokeless tobacco: Never Used  Substance and Sexual Activity  . Alcohol use: No  . Drug use: Yes    Frequency: 7.0 times per week  . Sexual activity: Not Currently    Birth control/protection: None, Pill  Other Topics Concern  . Not on file  Social History Narrative  . Not on file   Social Determinants of Health   Financial Resource Strain:   . Difficulty of Paying Living Expenses:   Food Insecurity:   . Worried About Programme researcher, broadcasting/film/video in the Last Year:   . Barista in the Last Year:   Transportation Needs:   . Freight forwarder (Medical):   Marland Kitchen Lack of Transportation (Non-Medical):   Physical Activity:   . Days of Exercise per Week:   . Minutes of Exercise per Session:   Stress:   . Feeling of Stress :    Social Connections:   . Frequency of Communication with Friends and Family:   . Frequency of Social Gatherings with Friends and Family:   . Attends Religious Services:   . Active Member of Clubs or Organizations:   . Attends Banker Meetings:   Marland Kitchen Marital Status:     Family History  Problem Relation Age of Onset  . Colon cancer Paternal Grandfather   . Diabetes Paternal Grandmother   . Cancer Maternal Grandmother        breast  . Cancer Maternal Grandfather   . Hypertension Mother   . Stroke Maternal Aunt   . Mental retardation Maternal Aunt   . Cancer Maternal Aunt        breast  . Mental retardation Maternal Uncle   . Hypertension Maternal Uncle   . Stroke Maternal Uncle   . Diabetes Maternal Uncle   . Cancer Maternal Uncle        throat     Current Outpatient Medications:  .  norgestimate-ethinyl estradiol (ORTHO-CYCLEN) 0.25-35 MG-MCG tablet,  Take 1 tablet by mouth daily., Disp: , Rfl:  .  trichloroacetic acid 80 % LIQD, Apply 1 application topically once for 1 dose. To be brought to office provider application, Disp: 15 mL, Rfl: 0  Review of Systems  Review of Systems  Constitutional: Negative for fever, chills, weight loss, malaise/fatigue and diaphoresis.  HENT: Negative for hearing loss, ear pain, nosebleeds, congestion, sore throat, neck pain, tinnitus and ear discharge.   Eyes: Negative for blurred vision, double vision, photophobia, pain, discharge and redness.  Respiratory: Negative for cough, hemoptysis, sputum production, shortness of breath, wheezing and stridor.   Cardiovascular: Negative for chest pain, palpitations, orthopnea, claudication, leg swelling and PND.  Gastrointestinal: negative for abdominal pain. Negative for heartburn, nausea, vomiting, diarrhea, constipation, blood in stool and melena.  Genitourinary: Negative for dysuria, urgency, frequency, hematuria and flank pain.  Musculoskeletal: Negative for myalgias, back pain, joint  pain and falls.  Skin: Negative for itching and rash.  Neurological: Negative for dizziness, tingling, tremors, sensory change, speech change, focal weakness, seizures, loss of consciousness, weakness and headaches.  Endo/Heme/Allergies: Negative for environmental allergies and polydipsia. Does not bruise/bleed easily.  Psychiatric/Behavioral: Negative for depression, suicidal ideas, hallucinations, memory loss and substance abuse. The patient is not nervous/anxious and does not have insomnia.        Objective:  Height 5\' 6"  (1.676 m), weight 224 lb (101.6 kg), last menstrual period 10/01/2019.   Physical Exam  Vitals reviewed. Constitutional: She is oriented to person, place, and time. She appears well-developed and well-nourished.  HENT:  Head: Normocephalic and atraumatic.        Right Ear: External ear normal.  Left Ear: External ear normal.  Nose: Nose normal.  Mouth/Throat: Oropharynx is clear and moist.  Eyes: Conjunctivae and EOM are normal. Pupils are equal, round, and reactive to light. Right eye exhibits no discharge. Left eye exhibits no discharge. No scleral icterus.  Neck: Normal range of motion. Neck supple. No tracheal deviation present. No thyromegaly present.  Cardiovascular: Normal rate, regular rhythm, normal heart sounds and intact distal pulses.  Exam reveals no gallop and no friction rub.   No murmur heard. Respiratory: Effort normal and breath sounds normal. No respiratory distress. She has no wheezes. She has no rales. She exhibits no tenderness.  GI: Soft. Bowel sounds are normal. She exhibits no distension and no mass. There is no tenderness. There is no rebound and no guarding.  Genitourinary:  Breasts no masses skin changes or nipple changes bilaterally      Vulva is normal without lesions Vagina is pink moist without discharge Cervix normal in appearance and pap is done Uterus is normal size shape and contour Adnexa is negative with normal sized ovaries   perianal warts multiple Musculoskeletal: Normal range of motion. She exhibits no edema and no tenderness.  Neurological: She is alert and oriented to person, place, and time. She has normal reflexes. She displays normal reflexes. No cranial nerve deficit. She exhibits normal muscle tone. Coordination normal.  Skin: Skin is warm and dry. No rash noted. No erythema. No pallor.  Psychiatric: She has a normal mood and affect. Her behavior is normal. Judgment and thought content normal.       Medications Ordered at today's visit: Meds ordered this encounter  Medications  . trichloroacetic acid 80 % LIQD    Sig: Apply 1 application topically once for 1 dose. To be brought to office provider application    Dispense:  15 mL    Refill:  0    Other orders placed at today's visit: Orders Placed This Encounter  Procedures  . HIV antibody (with reflex)  . RPR  . Hepatitis C Antibody      Assessment:    Healthy female exam.   recent termination Genital warts Plan:    Contraception: OCP (estrogen/progesterone). Follow up in: 1 year.   TCA prescribed  No follow-ups on file.

## 2019-11-02 LAB — CYTOLOGY - PAP
Chlamydia: NEGATIVE
Comment: NEGATIVE
Comment: NEGATIVE
Comment: NORMAL
Diagnosis: NEGATIVE
High risk HPV: NEGATIVE
Neisseria Gonorrhea: NEGATIVE

## 2019-11-22 ENCOUNTER — Ambulatory Visit: Payer: Medicaid Other | Admitting: Adult Health

## 2019-11-22 ENCOUNTER — Other Ambulatory Visit: Payer: Self-pay

## 2019-11-22 ENCOUNTER — Encounter: Payer: Self-pay | Admitting: Adult Health

## 2019-11-22 VITALS — BP 117/67 | HR 67 | Ht 66.0 in | Wt 219.0 lb

## 2019-11-22 DIAGNOSIS — A630 Anogenital (venereal) warts: Secondary | ICD-10-CM | POA: Diagnosis not present

## 2019-11-22 MED ORDER — NORGESTIMATE-ETH ESTRADIOL 0.25-35 MG-MCG PO TABS: 1.0000 | ORAL_TABLET | Freq: Every day | ORAL | 12 refills | Status: AC

## 2019-11-22 NOTE — Progress Notes (Signed)
  Subjective:     Patient ID: Latoya [name], female   DOB: Jan 09, 1991, 29 y.o.   MRN: [id number]  HPI Latoya [name] is a 29 year old black female, single, [id number], in for TCA application of perianal warts. She had pap with Dr Despina Hidden 11/01/19 and pap was negative for GC/CHL and hight risk HPV and malignancy.   Review of Systems +warts Reviewed past medical,surgical, social and family history. Reviewed medications and allergies.     Objective:   Physical Exam BP 117/67 (BP Location: Left Arm, Patient Position: Sitting, Cuff Size: Normal)   Pulse 67   Ht 5\' 6"  (1.676 m)   Wt 219 lb (99.3 kg)   BMI 35.35 kg/m  bleeding since TAB in February, on OCs Skin warm and dry.Pelvic: external genitalia is normal in appearance, vagina: has tampon in place, has 2 small warts near introitus, and  Has multiple small perianal warts, TCA 80% applied to perianal warts  Examination chaperoned by March LPN    Assessment:     1. Perianal venereal warts TCA applied today     Plan:     In about an hour, with warm water, wash bottom off and pat dry Return in 2 weeks for TCA application

## 2019-12-06 ENCOUNTER — Ambulatory Visit (INDEPENDENT_AMBULATORY_CARE_PROVIDER_SITE_OTHER): Payer: Medicaid Other | Admitting: Adult Health

## 2019-12-06 ENCOUNTER — Encounter: Payer: Self-pay | Admitting: Adult Health

## 2019-12-06 ENCOUNTER — Other Ambulatory Visit: Payer: Self-pay

## 2019-12-06 VITALS — BP 112/60 | HR 73 | Ht 66.0 in | Wt 214.0 lb

## 2019-12-06 DIAGNOSIS — A630 Anogenital (venereal) warts: Secondary | ICD-10-CM | POA: Diagnosis not present

## 2019-12-06 NOTE — Progress Notes (Signed)
  Subjective:     Patient ID: Latoya [name], female   DOB: 21-Oct-1990, 29 y.o.   MRN: [id number]  HPI Latoya [name] is a 29 year old black female,single, [id number] in for TCA application.   Review of Systems +perianal warts, some did resolve with last TCA Reviewed past medical,surgical, social and family history. Reviewed medications and allergies.     Objective:   Physical Exam BP 112/60 (BP Location: Left Arm, Patient Position: Sitting, Cuff Size: Normal)   Pulse 73   Ht 5\' 6"  (1.676 m)   Wt 214 lb (97.1 kg)   BMI 34.54 kg/m    Skin warm and dry.Pelvic: external genitalia is normal in appearance no lesions, has 1 wart at introitus, TCA applied, and has several perianal warts and has ares of pink where other have revolved from last TCA application, TCA 80% applied, has some burning. Examination chaperoned by LPN.   Assessment:     1. Perianal venereal warts TCA applied to warts    Plan:     Wash off in about 1 hour with warm water and pat dry Follow up in 3 weeks for another application if needed  Use condoms

## 2019-12-27 ENCOUNTER — Ambulatory Visit: Payer: Medicaid Other | Admitting: Adult Health

## 2020-01-23 ENCOUNTER — Ambulatory Visit: Payer: Medicaid Other | Admitting: Adult Health

## 2020-01-23 ENCOUNTER — Telehealth: Payer: Self-pay | Admitting: Adult Health

## 2020-01-23 MED ORDER — IMIQUIMOD 5 % EX CREA: TOPICAL_CREAM | CUTANEOUS | 0 refills | Status: AC

## 2020-01-23 NOTE — Telephone Encounter (Signed)
Telephoned patient at home number and left message to return call.  

## 2020-01-23 NOTE — Telephone Encounter (Signed)
Pt wants JG to follow up with her regarding the medicine she can buy over the counter to help with her symptoms. Patient needed to cancel todays appointment. And wants to take medicine at home rather than attending appointment if possible.

## 2020-01-23 NOTE — Addendum Note (Signed)
Addended by: Cyril Mourning A on: 01/23/2020 12:57 PM   Modules accepted: Orders

## 2020-01-23 NOTE — Telephone Encounter (Signed)
Patient returned call. Patient states would like prescription to apply at home for vaginal warts. Patient unable to come in due to work. Advised would send message to Cyril Mourning, NP. Patient voiced understanding.

## 2020-01-23 NOTE — Telephone Encounter (Signed)
Pt aware that aldara sent to drug store

## 2020-02-22 DIAGNOSIS — R109 Unspecified abdominal pain: Secondary | ICD-10-CM | POA: Diagnosis not present

## 2020-02-22 DIAGNOSIS — L905 Scar conditions and fibrosis of skin: Secondary | ICD-10-CM | POA: Diagnosis not present

## 2020-02-22 DIAGNOSIS — R1012 Left upper quadrant pain: Secondary | ICD-10-CM | POA: Diagnosis not present

## 2020-02-22 DIAGNOSIS — K5900 Constipation, unspecified: Secondary | ICD-10-CM | POA: Diagnosis not present

## 2020-02-22 DIAGNOSIS — R1010 Upper abdominal pain, unspecified: Secondary | ICD-10-CM | POA: Diagnosis not present

## 2020-02-22 DIAGNOSIS — M549 Dorsalgia, unspecified: Secondary | ICD-10-CM | POA: Diagnosis not present

## 2021-10-20 ENCOUNTER — Encounter (HOSPITAL_COMMUNITY): Payer: Self-pay

## 2021-10-20 ENCOUNTER — Other Ambulatory Visit: Payer: Self-pay

## 2021-10-20 ENCOUNTER — Ambulatory Visit (HOSPITAL_COMMUNITY)
Admission: EM | Admit: 2021-10-20 | Discharge: 2021-10-20 | Disposition: A | Discharge status: Home / Self Care | Payer: Medicaid Other | Attending: Nurse Practitioner | Admitting: Nurse Practitioner

## 2021-10-20 DIAGNOSIS — Z202 Contact with and (suspected) exposure to infections with a predominantly sexual mode of transmission: Secondary | ICD-10-CM | POA: Insufficient documentation

## 2021-10-20 DIAGNOSIS — A630 Anogenital (venereal) warts: Secondary | ICD-10-CM | POA: Diagnosis not present

## 2021-10-20 LAB — HIV ANTIBODY (ROUTINE TESTING W REFLEX): HIV Screen 4th Generation wRfx: NONREACTIVE

## 2021-10-20 NOTE — Discharge Instructions (Addendum)
-   We will let you know with any positive results and will prescribe treatment.  ?- Please follow up with OB/GYN at the number given to schedule an appointment and discuss treatment options for your concern. ?

## 2021-10-20 NOTE — ED Triage Notes (Signed)
Pt reports history of genital warts on her anus and vaginal area. She is not taking any medication at this time. She would like STD testing today. ?

## 2021-10-20 NOTE — ED Provider Notes (Signed)
?[facility] ? ? ? ?CSN: [id number] ?Arrival date & time: 10/20/21  1114 ? ? ?  ? ?History   ?Chief Complaint ?Chief Complaint  ?Patient presents with  ? Exposure to STD  ? ? ?HPI ?Latoya [name] is a 31 y.o. female.  ? ?Patient presents for STI screening.  She has known genital warts and is requesting assistance treating them.  She denies vaginal discharge, open sores, or lesions.  Denies fevers, nausea/vomiting.  ? ? ?Past Medical History:  ?Diagnosis Date  ? No pertinent past medical history   ? Vaginal Pap smear, abnormal   ? Warts, genital   ? ? ?Patient Active Problem List  ? Diagnosis Date Noted  ? Genital warts 11/22/2019  ? 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 History of shoulder dystocia in prior pregnancy 10/22/2017  ? Marijuana use 02/16/2017  ? Smoker 02/13/2017  ? Abnormal Pap smear of cervix 06/10/2016  ? ? ?Past Surgical History:  ?Procedure Laterality Date  ? DILATION AND CURETTAGE, DIAGNOSTIC / THERAPEUTIC    ? OTHER SURGICAL HISTORY    ? Lymph node removed from abdomen  ? ? ?OB History   ? ? Gravida  ?5  ? Para  ?2  ? Term  ?2  ? Preterm  ?0  ? AB  ?3  ? Living  ?2  ?  ? ? SAB  ?1  ? IAB  ?2  ? Ectopic  ?0  ? Multiple  ?0  ? Live Births  ?2  ?   ?  ?  ? ? ? ?Home Medications   ? ?Prior to Admission medications   ?Medication Sig Start Date End Date Taking? Authorizing Provider  ?imiquimod (ALDARA) 5 % cream Apply topically 3 (three) times a week. 01/23/20   Adline Potter, NP  ?norgestimate-ethinyl estradiol (ORTHO-CYCLEN) 0.25-35 MG-MCG tablet Take 1 tablet by mouth daily. 11/22/19   Adline Potter, NP  ? ? ?Family History ?Family History  ?Problem Relation Age of Onset  ? Colon cancer Paternal Grandfather   ? Diabetes Paternal Grandmother   ? Cancer Maternal Grandmother   ?     breast  ? Cancer Maternal Grandfather   ? Hypertension Mother   ? Stroke Maternal Aunt   ? Mental retardation Maternal Aunt   ? Cancer Maternal Aunt   ?     breast  ? Mental retardation Maternal Uncle    ? Hypertension Maternal Uncle   ? Stroke Maternal Uncle   ? Diabetes Maternal Uncle   ? Cancer Maternal Uncle   ?     throat  ? ? ?Social History ?Social History  ? ?Tobacco Use  ? Smoking status: Every Day  ?  Types: Cigarettes  ? Smokeless tobacco: Never  ?Vaping Use  ? Vaping Use: Never used  ?Substance Use Topics  ? Alcohol use: No  ? Drug use: Not Currently  ?  Frequency: 7.0 times per week  ? ? ? ?Allergies   ?Patient has no known allergies. ? ? ?Review of Systems ?Review of Systems ?Per HPI ? ?Physical Exam ?Triage Vital Signs ?ED Triage Vitals  ?Enc Vitals Group  ?   BP 10/20/21 1127 134/85  ?   Pulse Rate 10/20/21 1133 82  ?   Resp 10/20/21 1127 16  ?   Temp 10/20/21 1133 98.7 ?F (37.1 ?C)  ?   Temp Source 10/20/21 1133 Oral  ?   SpO2 10/20/21 1127 100 %  ?   Weight --   ?  Height --   ?   Head Circumference --   ?   Peak Flow --   ?   Pain Score --   ?   Pain Loc --   ?   Pain Edu? --   ?   Excl. in GC? --   ? ?No data found. ? ?Updated Vital Signs ?BP 134/85 (BP Location: Left Arm)   Pulse 82   Temp 98.7 ?F (37.1 ?C) (Oral)   Resp 16   SpO2 100%  ? ?Visual Acuity ?Right Eye Distance:   ?Left Eye Distance:   ?Bilateral Distance:   ? ?Right Eye Near:   ?Left Eye Near:    ?Bilateral Near:    ? ?Physical Exam ?Vitals and nursing note reviewed. Exam conducted with a chaperone present (Demetrice CarrollGraham, CMA).  ?Constitutional:   ?   General: She is not in acute distress. ?   Appearance: Normal appearance. She is not toxic-appearing.  ?Genitourinary: ?   Exam position: Lithotomy position.  ?   Pubic Area: No rash or pubic lice.   ?   Labia:     ?   Right: Lesion present. No rash.     ?   Left: No rash.   ?   Vagina: Vaginal discharge present. No tenderness or bleeding.  ?   Cervix: Normal.  ? ? ?   Comments: Flesh colored, flat lesion to upper right labia; similar looking lesion to left lower labia; multiple lesions in clusters around anal area ?Lymphadenopathy:  ?   Lower Body: No right inguinal  adenopathy. No left inguinal adenopathy.  ?Skin: ?   General: Skin is warm and dry.  ?   Capillary Refill: Capillary refill takes less than 2 seconds.  ?   Coloration: Skin is not jaundiced or pale.  ?   Findings: No erythema.  ?Neurological:  ?   Mental Status: She is alert and oriented to person, place, and time.  ?   Motor: No weakness.  ?   Gait: Gait normal.  ?Psychiatric:     ?   Behavior: Behavior is cooperative.  ? ? ? ?UC Treatments / Results  ?Labs ?(all labs ordered are listed, but only abnormal results are displayed) ?Labs Reviewed  ?HIV ANTIBODY (ROUTINE TESTING W REFLEX)  ?RPR  ?CERVICOVAGINAL ANCILLARY ONLY  ? ? ?EKG ? ? ?Radiology ?No results found. ? ?Procedures ?Procedures (including critical care time) ? ?Medications Ordered in UC ?Medications - No data to display ? ?Initial Impression / Assessment and Plan / UC Course  ?I have reviewed the triage vital signs and the nursing notes. ? ?Pertinent labs & imaging results that were available during my care of the patient were reviewed by me and considered in my medical decision making (see chart for details). ? ?  ?Will screen for STI and notify and prescribe treatment as indicated.  Discussed that there are multiple topical options for genital warts; encouraged her to follow up with OB/GYN for discussion of treatment and contact information given.   ?Final Clinical Impressions(s) / UC Diagnoses  ? ?Final diagnoses:  ?Possible exposure to STD  ?Genital warts  ? ? ? ?Discharge Instructions   ? ?  ?- We will let you know with any positive results and will prescribe treatment.  ?- Please follow up with OB/GYN at the number given to schedule an appointment and discuss treatment options for your concern. ? ? ? ? ?ED Prescriptions   ?None ?  ? ?PDMP not reviewed  this encounter. ?  ?Valentino Nose, NP ?10/20/21 1256 ? ?

## 2021-10-21 LAB — CERVICOVAGINAL ANCILLARY ONLY
Bacterial Vaginitis (gardnerella): POSITIVE — AB
Candida Glabrata: NEGATIVE
Candida Vaginitis: POSITIVE — AB
Chlamydia: POSITIVE — AB
Comment: NEGATIVE
Comment: NEGATIVE
Comment: NEGATIVE
Comment: NEGATIVE
Comment: NEGATIVE
Comment: NORMAL
Neisseria Gonorrhea: NEGATIVE
Trichomonas: NEGATIVE

## 2021-10-21 LAB — RPR: RPR Ser Ql: NONREACTIVE

## 2021-10-22 ENCOUNTER — Telehealth (HOSPITAL_COMMUNITY): Payer: Self-pay | Admitting: Emergency Medicine

## 2021-10-22 MED ORDER — DOXYCYCLINE HYCLATE 100 MG PO CAPS: 100.0000 mg | ORAL_CAPSULE | Freq: Two times a day (BID) | ORAL | 0 refills | Status: DC

## 2021-10-22 MED ORDER — FLUCONAZOLE 150 MG PO TABS: 150.0000 mg | ORAL_TABLET | Freq: Once | ORAL | 0 refills | Status: DC

## 2021-10-22 MED ORDER — METRONIDAZOLE 500 MG PO TABS: 500.0000 mg | ORAL_TABLET | Freq: Two times a day (BID) | ORAL | 0 refills | Status: AC

## 2021-10-22 MED ORDER — DOXYCYCLINE HYCLATE 100 MG PO CAPS: 100.0000 mg | ORAL_CAPSULE | Freq: Two times a day (BID) | ORAL | 0 refills | Status: AC

## 2021-10-22 MED ORDER — METRONIDAZOLE 500 MG PO TABS: 500.0000 mg | ORAL_TABLET | Freq: Two times a day (BID) | ORAL | 0 refills | Status: DC

## 2021-10-22 MED ORDER — FLUCONAZOLE 150 MG PO TABS: 150.0000 mg | ORAL_TABLET | Freq: Once | ORAL | 0 refills | Status: AC

## 2021-10-22 NOTE — Telephone Encounter (Signed)
Pt requesting medications to be switched to [facility] on [address] [facility].  ?
